# Patient Record
Sex: Female | Born: 1935
Health system: Southern US, Community
[De-identification: ages and names within clinical notes are randomized; demographics above are authoritative.]

## PROBLEM LIST (undated history)

## (undated) DIAGNOSIS — E785 Hyperlipidemia, unspecified: Secondary | ICD-10-CM

## (undated) DIAGNOSIS — R310 Gross hematuria: Secondary | ICD-10-CM

## (undated) DIAGNOSIS — I471 Supraventricular tachycardia, unspecified: Secondary | ICD-10-CM

## (undated) DIAGNOSIS — I10 Essential (primary) hypertension: Secondary | ICD-10-CM

## (undated) DIAGNOSIS — N760 Acute vaginitis: Secondary | ICD-10-CM

## (undated) HISTORY — PX: APPENDECTOMY: SHX54

## (undated) HISTORY — DX: Gross hematuria: R31.0

## (undated) HISTORY — DX: Acute vaginitis: N76.0

## (undated) HISTORY — DX: Hyperlipidemia, unspecified: E78.5

## (undated) HISTORY — PX: CARPAL TUNNEL RELEASE: SHX101

## (undated) HISTORY — PX: DILATION AND CURETTAGE OF UTERUS: SHX78

## (undated) HISTORY — DX: Supraventricular tachycardia, unspecified: I47.10

## (undated) HISTORY — DX: Essential (primary) hypertension: I10

## (undated) HISTORY — PX: OTHER SURGICAL HISTORY: SHX169

## (undated) HISTORY — PX: CATARACT EXTRACTION: SUR2

## (undated) HISTORY — DX: Supraventricular tachycardia: I47.1

## (undated) HISTORY — PX: CYSTOSCOPY: SUR368

---

## 2008-05-20 ENCOUNTER — Emergency Department (HOSPITAL_BASED_OUTPATIENT_CLINIC_OR_DEPARTMENT_OTHER): Admission: EM | Admit: 2008-05-20 | Discharge: 2008-05-20 | Payer: Self-pay | Admitting: Emergency Medicine

## 2008-07-19 ENCOUNTER — Encounter: Payer: Self-pay | Admitting: Emergency Medicine

## 2008-07-19 ENCOUNTER — Encounter (INDEPENDENT_AMBULATORY_CARE_PROVIDER_SITE_OTHER): Payer: Self-pay | Admitting: Internal Medicine

## 2008-07-19 ENCOUNTER — Inpatient Hospital Stay (HOSPITAL_COMMUNITY): Admission: AD | Admit: 2008-07-19 | Discharge: 2008-07-20 | Payer: Self-pay | Admitting: Internal Medicine

## 2008-09-14 ENCOUNTER — Ambulatory Visit: Payer: Self-pay | Admitting: Internal Medicine

## 2008-09-25 DIAGNOSIS — E785 Hyperlipidemia, unspecified: Secondary | ICD-10-CM

## 2008-09-25 DIAGNOSIS — I471 Supraventricular tachycardia: Secondary | ICD-10-CM | POA: Insufficient documentation

## 2008-09-25 DIAGNOSIS — I10 Essential (primary) hypertension: Secondary | ICD-10-CM | POA: Insufficient documentation

## 2008-10-12 ENCOUNTER — Ambulatory Visit: Payer: Self-pay | Admitting: Family Medicine

## 2008-10-12 DIAGNOSIS — N952 Postmenopausal atrophic vaginitis: Secondary | ICD-10-CM

## 2008-10-12 DIAGNOSIS — R319 Hematuria, unspecified: Secondary | ICD-10-CM | POA: Insufficient documentation

## 2008-10-12 DIAGNOSIS — M810 Age-related osteoporosis without current pathological fracture: Secondary | ICD-10-CM

## 2008-10-12 LAB — CONVERTED CEMR LAB
Blood in Urine, dipstick: NEGATIVE
Nitrite: NEGATIVE
Protein, U semiquant: NEGATIVE

## 2008-10-15 ENCOUNTER — Encounter (INDEPENDENT_AMBULATORY_CARE_PROVIDER_SITE_OTHER): Payer: Self-pay | Admitting: *Deleted

## 2008-10-15 LAB — CONVERTED CEMR LAB
ALT: 19 units/L (ref 0–35)
AST: 20 units/L (ref 0–37)
Albumin: 3.9 g/dL (ref 3.5–5.2)
Basophils Relative: 0.1 % (ref 0.0–3.0)
Calcium: 9.6 mg/dL (ref 8.4–10.5)
Creatinine, Ser: 0.5 mg/dL (ref 0.4–1.2)
Eosinophils Relative: 2.1 % (ref 0.0–5.0)
HDL: 66.7 mg/dL (ref >39.0)
LDL Cholesterol: 112 mg/dL — ABNORMAL HIGH (ref 0–99)
MCHC: 34.8 g/dL (ref 30.0–36.0)
Platelets: 188 10*3/uL (ref 150–400)
RBC: 4.55 M/uL (ref 3.87–5.11)
RDW: 13.4 % (ref 11.5–14.6)
Sodium: 137 meq/L (ref 135–145)
Total Bilirubin: 0.9 mg/dL (ref 0.3–1.2)
Total CHOL/HDL Ratio: 2.9
Total Protein: 7.2 g/dL (ref 6.0–8.3)
Triglycerides: 72 mg/dL (ref 0–149)
VLDL: 14 mg/dL (ref 0–40)

## 2008-11-07 LAB — HM DEXA SCAN

## 2008-11-16 ENCOUNTER — Encounter: Payer: Self-pay | Admitting: Family Medicine

## 2008-11-26 ENCOUNTER — Ambulatory Visit: Payer: Self-pay | Admitting: Family Medicine

## 2008-11-28 ENCOUNTER — Encounter (INDEPENDENT_AMBULATORY_CARE_PROVIDER_SITE_OTHER): Payer: Self-pay | Admitting: *Deleted

## 2009-04-05 ENCOUNTER — Encounter (INDEPENDENT_AMBULATORY_CARE_PROVIDER_SITE_OTHER): Payer: Self-pay | Admitting: *Deleted

## 2009-04-10 ENCOUNTER — Ambulatory Visit: Payer: Self-pay | Admitting: Family Medicine

## 2009-04-10 LAB — CONVERTED CEMR LAB
Glucose, Urine, Semiquant: NEGATIVE
Protein, U semiquant: NEGATIVE
Specific Gravity, Urine: 1.02
Urobilinogen, UA: 0.2
WBC Urine, dipstick: NEGATIVE

## 2009-04-12 ENCOUNTER — Telehealth (INDEPENDENT_AMBULATORY_CARE_PROVIDER_SITE_OTHER): Payer: Self-pay | Admitting: *Deleted

## 2009-04-12 LAB — CONVERTED CEMR LAB
AST: 23 units/L (ref 0–37)
Alkaline Phosphatase: 53 units/L (ref 39–117)
Basophils Absolute: 0 10*3/uL (ref 0.0–0.1)
CO2: 30 meq/L (ref 19–32)
Calcium: 9.6 mg/dL (ref 8.4–10.5)
Chloride: 103 meq/L (ref 96–112)
Cholesterol: 198 mg/dL (ref 0–200)
Creatinine, Ser: 0.7 mg/dL (ref 0.4–1.2)
Eosinophils Relative: 1.6 % (ref 0.0–5.0)
GFR calc non Af Amer: 86.93 mL/min (ref >60)
Lymphocytes Relative: 22.3 % (ref 12.0–46.0)
Potassium: 4.1 meq/L (ref 3.5–5.1)
Sodium: 140 meq/L (ref 135–145)
Total Bilirubin: 1 mg/dL (ref 0.3–1.2)
Total Protein: 7.6 g/dL (ref 6.0–8.3)
Triglycerides: 59 mg/dL (ref 0.0–149.0)
VLDL: 11.8 mg/dL (ref 0.0–40.0)
WBC: 7.5 10*3/uL (ref 4.5–10.5)

## 2009-04-16 ENCOUNTER — Telehealth (INDEPENDENT_AMBULATORY_CARE_PROVIDER_SITE_OTHER): Payer: Self-pay | Admitting: *Deleted

## 2009-05-08 ENCOUNTER — Ambulatory Visit: Payer: Self-pay | Admitting: Internal Medicine

## 2009-05-09 ENCOUNTER — Ambulatory Visit: Payer: Self-pay | Admitting: Family Medicine

## 2009-05-09 DIAGNOSIS — R300 Dysuria: Secondary | ICD-10-CM | POA: Insufficient documentation

## 2009-05-09 LAB — CONVERTED CEMR LAB
Glucose, Urine, Semiquant: NEGATIVE
Nitrite: NEGATIVE
Protein, U semiquant: NEGATIVE
Specific Gravity, Urine: 1.02
WBC Urine, dipstick: NEGATIVE

## 2009-05-10 ENCOUNTER — Encounter: Payer: Self-pay | Admitting: Family Medicine

## 2009-05-13 ENCOUNTER — Encounter: Payer: Self-pay | Admitting: Family Medicine

## 2009-06-14 ENCOUNTER — Encounter (INDEPENDENT_AMBULATORY_CARE_PROVIDER_SITE_OTHER): Payer: Self-pay | Admitting: *Deleted

## 2009-08-20 ENCOUNTER — Telehealth (INDEPENDENT_AMBULATORY_CARE_PROVIDER_SITE_OTHER): Payer: Self-pay | Admitting: *Deleted

## 2009-10-16 ENCOUNTER — Telehealth (INDEPENDENT_AMBULATORY_CARE_PROVIDER_SITE_OTHER): Payer: Self-pay | Admitting: *Deleted

## 2009-10-24 ENCOUNTER — Ambulatory Visit: Payer: Self-pay | Admitting: Family Medicine

## 2009-10-24 LAB — CONVERTED CEMR LAB
Bilirubin Urine: NEGATIVE
Nitrite: NEGATIVE
Specific Gravity, Urine: 1.02
WBC Urine, dipstick: NEGATIVE
pH: 6

## 2009-10-25 ENCOUNTER — Encounter: Payer: Self-pay | Admitting: Family Medicine

## 2009-11-07 ENCOUNTER — Ambulatory Visit: Payer: Self-pay | Admitting: Family Medicine

## 2009-11-07 LAB — CONVERTED CEMR LAB
Glucose, Urine, Semiquant: NEGATIVE
Ketones, urine, test strip: NEGATIVE
Specific Gravity, Urine: 1.01
Urobilinogen, UA: 0.2

## 2010-03-25 ENCOUNTER — Telehealth (INDEPENDENT_AMBULATORY_CARE_PROVIDER_SITE_OTHER): Payer: Self-pay | Admitting: *Deleted

## 2010-04-02 ENCOUNTER — Ambulatory Visit: Payer: Self-pay | Admitting: Family Medicine

## 2010-04-03 LAB — CONVERTED CEMR LAB
BUN: 17 mg/dL (ref 6–23)
Basophils Absolute: 0 10*3/uL (ref 0.0–0.1)
Basophils Relative: 0.2 % (ref 0.0–3.0)
Bilirubin, Direct: 0.2 mg/dL (ref 0.0–0.3)
Chloride: 99 meq/L (ref 96–112)
Creatinine, Ser: 0.6 mg/dL (ref 0.4–1.2)
GFR calc non Af Amer: 107.71 mL/min (ref >60)
HCT: 43.6 % (ref 36.0–46.0)
HDL: 52.7 mg/dL (ref >39.00)
Hgb A1c MFr Bld: 5.7 % (ref 4.6–6.5)
Lymphocytes Relative: 13.4 % (ref 12.0–46.0)
Lymphs Abs: 1.4 10*3/uL (ref 0.7–4.0)
MCHC: 33.3 g/dL (ref 30.0–36.0)
Monocytes Relative: 9.5 % (ref 3.0–12.0)
Neutro Abs: 7.7 10*3/uL (ref 1.4–7.7)
RDW: 14.8 % — ABNORMAL HIGH (ref 11.5–14.6)
TSH: 1.33 microintl units/mL (ref 0.35–5.50)
Total Bilirubin: 0.7 mg/dL (ref 0.3–1.2)
Total CHOL/HDL Ratio: 3
VLDL: 12.6 mg/dL (ref 0.0–40.0)
Vit D, 25-Hydroxy: 52 ng/mL (ref 30–89)
WBC: 10.2 10*3/uL (ref 4.5–10.5)

## 2010-04-20 ENCOUNTER — Emergency Department (HOSPITAL_BASED_OUTPATIENT_CLINIC_OR_DEPARTMENT_OTHER): Admission: EM | Admit: 2010-04-20 | Discharge: 2010-04-20 | Payer: Self-pay | Admitting: Emergency Medicine

## 2010-04-29 ENCOUNTER — Encounter: Payer: Self-pay | Admitting: Family Medicine

## 2010-06-23 ENCOUNTER — Encounter (INDEPENDENT_AMBULATORY_CARE_PROVIDER_SITE_OTHER): Payer: Self-pay | Admitting: *Deleted

## 2010-06-23 ENCOUNTER — Encounter: Payer: Self-pay | Admitting: Family Medicine

## 2010-09-19 ENCOUNTER — Encounter: Payer: Self-pay | Admitting: Family Medicine

## 2010-10-02 NOTE — Progress Notes (Signed)
Summary: refill  Phone Note Refill Request Message from:  Fax from Pharmacy on March 25, 2010 9:38 AM  Refills Requested: Medication #1:  DIOVAN 160 MG TABS take one tablet daily  Medication #2:  SPIRONOLACTONE 25 MG TABS take one tablet two times a day [facility] fax [phone] Jani Files [phone]  Initial call taken by: Okey Regal [place],  March 25, 2010 9:38 AM    Prescriptions: SPIRONOLACTONE 25 MG TABS (SPIRONOLACTONE) take one tablet two times a day  #180 x 0   Entered by:   Jeremy Johann CMA   Authorized by:   Loreen Freud DO   Signed by:   Jeremy Johann CMA on 03/25/2010   Method used:   Faxed to ...       [facility] (mail-order)       [address]       [place], [place]  [address]       Ph: [phone]       Fax: [phone]   RxID:   [id number] DIOVAN 160 MG TABS (VALSARTAN) take one tablet daily  #90 x 0   Entered by:   Jeremy Johann CMA   Authorized by:   Loreen Freud DO   Signed by:   Jeremy Johann CMA on 03/25/2010   Method used:   Faxed to ...       [facility] (mail-order)       [address]       [place], [place]  [address]       Ph: [phone]       Fax: [phone]   RxID:   [id number]

## 2010-10-02 NOTE — Consult Note (Signed)
[facility]   Imported By: Lanelle Bal 05/19/2010 10:17:40  _____________________________________________________________________  External Attachment:    Type:   Image     Comment:   External Document

## 2010-10-02 NOTE — Assessment & Plan Note (Signed)
Summary: CPX   Vital Signs:  Patient profile:   77 year old female Height:      59 inches Weight:      125 pounds BMI:     25.34 Pulse rate:   72 / minute BP sitting:   120 / 80  (left arm)  Vitals Entered By: Doristine Devoid CMA (April 02, 2010 9:49 AM) CC: CPX AND LABS  Vision Screening:      Vision Comments: corrected to 20/20 bilaterally- cataract surgery 01/2010   History of Present Illness: 77 yo woman here today for CPE.  Here for Medicare AWV:  1.   Risk factors based on Past M, S, F history: HTN- BP well controlled on current meds.  asymptomatic. Hyperlipidemia- taking fish oil b/c she is statin intolerant, due for labs SVT- was told by cards to f/u as needed.  no recent sxs. Hematuria- pt asymptomatic recently but has had microscopic in the past w/out urologic w/u Osteroporosis- over due for Dexa.  on fosamax and Ca 2.   Physical Activities:  [facility]obics 3.   Depression/mood: no depressive sxs 4.   Hearing: no concerns 5.   ADL's: independent 6.   Fall Risk:  not at risk 7.   Home Safety: safe at home, lives alone but sister lives down the street, daughter local 8.   Height, weight, &visual acuity: see vitals, eye exam done after Cataract sugery in June- corrected to 20/20 w/ new lenses. 9.   Counseling:  10.   Labs ordered based on risk factors: See A&P 11.           Referral Coordination:  overdue for DEXA, mammogram.  still not interested in colonoscopy.  no longer having paps or pelvic exams. 12.           Care Plan:  See A&P 13.           Cognitive Assessment: oriented x3, W-O-R-L-D, 3/3 object recall  Preventive Screening-Counseling & Management  Alcohol-Tobacco     Alcohol drinks/day: 0     Smoking Status: quit  Caffeine-Diet-Exercise     Does Patient Exercise: yes      Drug Use:  never.    Problems Prior to Update: 1)  Physical Examination  (ICD-V70.0) 2)  Dysuria  (ICD-788.1) 3)  Hematuria  (ICD-599.70) 4)   Osteoporosis  (ICD-733.00) 5)  Vaginitis, Atrophic, Postmenopausal  (ICD-627.3) 6)  Hyperlipidemia-mixed  (ICD-272.4) 7)  Hypertension, Benign  (ICD-401.1) 8)  Svt/ Psvt/ Pat  (ICD-427.0)  Current Medications (verified): 1)  Diovan 160 Mg Tabs (Valsartan) .... Take One Tablet Daily 2)  Spironolactone 25 Mg Tabs (Spironolactone) .... Take One Tablet Two Times A Day 3)  Nadolol 20 Mg Tabs (Nadolol) .... Take One Tablet Daily 4)  Fish Oil 1000 Mg Caps (Omega-3 Fatty Acids) .... Take One Tablet Two Times A Day 5)  Ocuvite  Tabs (Multiple Vitamins-Minerals) .... Take One Tablet Two Times A Day 6)  Calcium 600 600 Mg Tabs (Calcium Carbonate) .... Take One Tablet Two Times A Day 7)  Fosamax 70 Mg Tabs (Alendronate Sodium) .... Take One Tablet Every Week 8)  Estrace 0.1 Mg/gm Crea (Estradiol) .... Use Once Weekly 9)  Vitamin D3 400 Unit Tabs (Cholecalciferol) .... Once Daily 10)  Zylanmental .... Take One Tablet Two Times A Day- Vitamin Supplement  Allergies (verified): 1)  ! Sulfa 2)  ! * Statins 3)  ! * Lisinopril 4)  ! * Zetia  5)  ! Hydrochlorothiazide 6)  ! Norvasc 7)  * Latex  Past History:  Past Medical History: Last updated: 10/12/2008  1. Hypertension.   2. Hyperlipidemia.   3. Gross hematuria   4. Supraventricular tachycardia   5. Atropic vaginitis  6. Osteoporosis  Family History: Last updated: 04/10/2009 CAD-no HTN-mother,father DM-maternal grandmother STROKE-no COLON CA-no BREAST CA-no  mother is 23 father died at 65 from post-op PNA  Social History: Last updated: 10/12/2008 The patient lives in [place], [place].  She has a history of tobacco, less than 1 pack per day, but quit 10 years ago.  She rarely drinks alcohol.  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  Widow/Widower (husband died 8)  Past Surgical History: Caesarean section D&C x1 miscarriage x2 R fibrocystic mass removed Appendectomy Cataract extraction- L eye Carpal  tunnel release- R wrist  Social History: Smoking Status:  quit Does Patient Exercise:  yes Drug Use:  never  Review of Systems       The patient complains of weight loss.  The patient denies anorexia, fever, weight gain, vision loss, decreased hearing, hoarseness, chest pain, syncope, dyspnea on exertion, peripheral edema, prolonged cough, headaches, abdominal pain, melena, severe indigestion/heartburn, hematuria, suspicious skin lesions, depression, abnormal bleeding, enlarged lymph nodes, and breast masses.         pt has been watching her weight recently- controlling portions, fat intake.  Physical Exam  General:  Well-developed,well-nourished,in no acute distress; alert,appropriate and cooperative throughout examination Head:  Normocephalic and atraumatic without obvious abnormalities. No apparent alopecia or balding. Eyes:  No corneal or conjunctival inflammation noted. EOMI. Perrla. Funduscopic exam deferred to ophtho Elmer Picker) Ears:  External ear exam shows no significant lesions or deformities.  Otoscopic examination reveals clear canals, tympanic membranes are intact bilaterally without bulging, retraction, inflammation or discharge. Hearing is grossly normal bilaterally. Nose:  External nasal examination shows no deformity or inflammation. Nasal mucosa are pink and moist without lesions or exudates. Mouth:  Oral mucosa and oropharynx without lesions or exudates.  Teeth in good repair. Neck:  No deformities, masses, or tenderness noted. Breasts:  No mass, nodules, thickening, tenderness, bulging, retraction, inflamation, nipple discharge or skin changes noted.   Lungs:  Normal respiratory effort, chest expands symmetrically. Lungs are clear to auscultation, no crackles or wheezes. Heart:  reg S1/S2 Abdomen:  Bowel sounds positive,abdomen soft and non-tender without masses, organomegaly or hernias noted. Genitalia:  deferred at pt's request Pulses:  +2 carotid, radial,  DP Extremities:  no C/C/E Neurologic:  No cranial nerve deficits noted. Station and gait are normal. Plantar reflexes are down-going bilaterally. DTRs are symmetrical throughout. Sensory, motor and coordinative functions appear intact. Skin:  Intact without suspicious lesions or rashes Cervical Nodes:  No lymphadenopathy noted Axillary Nodes:  No palpable lymphadenopathy Psych:  Cognition and judgment appear intact. Alert and cooperative with normal attention span and concentration. No apparent delusions, illusions, hallucinations   Impression & Recommendations:  Problem # 1:  PHYSICAL EXAMINATION (ICD-V70.0) Assessment New pt's PE WNL.  pneumovax given so pt is now UTD.  check labs.  overdue for mammo and DEXA- plans on scheduling.  Problem # 2:  HYPERTENSION, BENIGN (ICD-401.1) Assessment: Unchanged well controlled on current meds.  asymptomatic.  no changes at this time. Her updated medication list for this problem includes:    Diovan 160 Mg Tabs (Valsartan) .Marland Kitchen... Take one tablet daily    Spironolactone 25 Mg Tabs (Spironolactone) .Marland Kitchen... Take one tablet two times a day  Nadolol 20 Mg Tabs (Nadolol) .Marland Kitchen... Take one tablet daily  Orders: Venipuncture ([address]) TLB-BMP (Basic Metabolic Panel-BMET) (80048-METABOL) TLB-CBC Platelet - w/Differential (85025-CBCD) TLB-TSH (Thyroid Stimulating Hormone) (84443-TSH)  Problem # 3:  HYPERLIPIDEMIA-MIXED (ICD-272.4) Assessment: Unchanged due for labs.  intolerant of statins Orders: TLB-Lipid Panel (80061-LIPID) TLB-Hepatic/Liver Function Pnl (80076-HEPATIC)  Problem # 4:  HEMATURIA (ICD-599.70) Assessment: Unchanged if persistant blood pt now agreeable to urology w/u. Orders: UA Dipstick w/o Micro (manual) ([address]454)  Problem # 5:  OSTEOPOROSIS (ICD-733.00) Assessment: Unchanged pt due for DEXA.  will schedule.  continue meds at this time. Her updated medication list for this problem includes:    Calcium 600 600 Mg Tabs (Calcium  carbonate) .Marland Kitchen... Take one tablet two times a day    Fosamax 70 Mg Tabs (Alendronate sodium) .Marland Kitchen... Take one tablet every week    Vitamin D3 400 Unit Tabs (Cholecalciferol) ..... Once daily  Orders: T-Vitamin D (25-Hydroxy) [phone])  Problem # 6:  SVT/ PSVT/ PAT (ICD-427.0) Assessment: Unchanged asymptomatic today.  released by cards- to f/u as needed. Her updated medication list for this problem includes:    Nadolol 20 Mg Tabs (Nadolol) .Marland Kitchen... Take one tablet daily  Complete Medication List: 1)  Diovan 160 Mg Tabs (Valsartan) .... Take one tablet daily 2)  Spironolactone 25 Mg Tabs (Spironolactone) .... Take one tablet two times a day 3)  Nadolol 20 Mg Tabs (Nadolol) .... Take one tablet daily 4)  Fish Oil 1000 Mg Caps (Omega-3 fatty acids) .... Take one tablet two times a day 5)  Ocuvite Tabs (Multiple vitamins-minerals) .... Take one tablet two times a day 6)  Calcium 600 600 Mg Tabs (Calcium carbonate) .... Take one tablet two times a day 7)  Fosamax 70 Mg Tabs (Alendronate sodium) .... Take one tablet every week 8)  Estrace 0.1 Mg/gm Crea (Estradiol) .... Use once weekly 9)  Vitamin D3 400 Unit Tabs (Cholecalciferol) .... Once daily 10)  Zylanmental  .... Take one tablet two times a day- vitamin supplement  Patient Instructions: 1)  Call and schedule your mammogram and bone density 2)  You look great!  Keep up the good work! 3)  We'll notify you of your lab results 4)  Call with any questions or concerns 5)  See you in 6 months!  Appended Document: CPX     Clinical Lists Changes  Orders: Added new Service order of Pneumococcal Vaccine ([address]) - Signed Added new Service order of Admin 1st Vaccine (13086) - Signed Observations: Added new observation of PNEUMOVAXLOT: 0578AA (04/02/2010 10:26) Added new observation of PNEUMOVAXEXP: 09/17/2011 (04/02/2010 10:26) Added new observation of PNEUMOVAXBY: Chemira Jones CMA (04/02/2010 10:26) Added new observation of  PNEUMOVAXRTE: IM (04/02/2010 10:26) Added new observation of PNEUMOVAXDOS: 0.5 ml (04/02/2010 10:26) Added new observation of PNEUMOVAXMFR: Merck (04/02/2010 10:26) Added new observation of PNEUMOVAXSIT: left deltoid (04/02/2010 10:26) Added new observation of PNEUMOVAX: Pneumovax (04/02/2010 10:26)       Immunizations Administered:  Pneumonia Vaccine:    Vaccine Type: Pneumovax    Site: left deltoid    Mfr: Merck    Dose: 0.5 ml    Route: IM    Given by: Doristine Devoid CMA    Exp. Date: 09/17/2011    Lot #: [id number]  Appended Document: CPX   Appended Document: CPX     Clinical Lists Changes  Orders: Added new Referral order of Urology Referral (Urology) - Signed

## 2010-10-02 NOTE — Progress Notes (Signed)
Summary: rx  Phone Note Refill Request Message from:  Patient  Refills Requested: Medication #1:  SPIRONOLACTONE 25 MG TABS take one tablet two times a day pt needs rx sent to [facility], going out of town next few days, will make appt when back in town  Initial call taken by: Kandice Hams,  October 16, 2009 1:58 PM  Follow-up for Phone Call        pt informed rx faxed .Kandice Hams  October 16, 2009 2:01 PM     Prescriptions: SPIRONOLACTONE 25 MG TABS (SPIRONOLACTONE) take one tablet two times a day  #60 x 0   Entered by:   Kandice Hams   Authorized by:   Nolon Rod. Paz MD   Signed by:   Kandice Hams on 10/16/2009   Method used:   Faxed to ...       [facility] [id number] (retail)       [address] [place], [place]  [address]       Ph: [phone] or [phone]       Fax: [phone]   RxID:   [id number]

## 2010-10-02 NOTE — Assessment & Plan Note (Signed)
Summary: blood in stool//lch   Vital Signs:  Patient profile:   77 year old female Weight:      135 pounds Temp:     98.2 degrees F oral Pulse rate:   80 / minute Pulse rhythm:   regular BP sitting:   122 / 80  (left arm) Cuff size:   regular  Vitals Entered By: Army Fossa CMA (October 24, 2009 2:46 PM) CC: Pt states she saw blood in the toliet this a.m. she first thought it was from her stool she now believes in maybe her urine. , Dysuria   History of Present Illness:  Dysuria      This is a 77 year old woman who presents with Dysuria.  The symptoms began 1 day ago.  The patient complains of urinary frequency and urgency, but denies burning with urination, hematuria, vaginal discharge, vaginal itching, and vaginal sores.  The patient denies the following associated symptoms: nausea, vomiting, fever, shaking chills, flank pain, abdominal pain, back pain, pelvic pain, and arthralgias.  The patient denies the following risk factors: diabetes, prior antibiotics, immunosuppression, history of GU anomaly, history of pyelonephritis, pregnancy, history of STD, and analgesic abuse.  History is significant for recent UTI.    Allergies: 1)  ! Sulfa 2)  ! * Statins 3)  ! * Lisinopril 4)  ! * Zetia 5)  ! Hydrochlorothiazide 6)  ! Norvasc 7)  * Latex  Past History:  Past medical, surgical, family and social histories (including risk factors) reviewed for relevance to current acute and chronic problems.  Past Medical History: Reviewed history from 10/12/2008 and no changes required.  1. Hypertension.   2. Hyperlipidemia.   3. Gross hematuria   4. Supraventricular tachycardia   5. Atropic vaginitis  6. Osteoporosis  Past Surgical History: Reviewed history from 10/12/2008 and no changes required. Caesarean section D&C x1 miscarriage x2 R fibrocystic mass removed Appendectomy  Family History: Reviewed history from 04/10/2009 and no changes  required. CAD-no HTN-mother,father DM-maternal grandmother STROKE-no COLON CA-no BREAST CA-no  mother is 71 father died at 93 from post-op PNA  Social History: Reviewed history from 10/12/2008 and no changes required. [place].  She has a history of tobacco, less than 1 pack per day, but quit 10 years ago.  She rarely drinks alcohol.  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  Widow/Widower (husband died 01/12/1999)  Review of Systems      See HPI  Physical Exam  General:  Well-developed,well-nourished,in no acute distress; alert,appropriate and cooperative throughout examination Psych:  Oriented X3 and normally interactive.     Impression & Recommendations:  Problem # 1:  HEMATURIA (ICD-599.70)  Her updated medication list for this problem includes:    Cipro 500 Mg Tabs (Ciprofloxacin hcl) .Marland Kitchen... 1 by mouth two times a day  Orders: T-Culture, Urine ([id number]) UA Dipstick w/o Micro (manual) (09811)  Discussed medication use and hematuria work-up.   Complete Medication List: 1)  Diovan 160 Mg Tabs (Valsartan) .... Take one tablet daily 2)  Spironolactone 25 Mg Tabs (Spironolactone) .... Take one tablet two times a day 3)  Nadolol 20 Mg Tabs (Nadolol) .... Take one tablet daily 4)  Fish Oil 1000 Mg Caps (Omega-3 fatty acids) .... Take one tablet two times a day 5)  Ocuvite Tabs (Multiple vitamins-minerals) .... Take one tablet two times a day 6)  Calcium 600 600 Mg Tabs (Calcium carbonate) .... Take one tablet two times a day  7)  Fosamax 70 Mg Tabs (Alendronate sodium) .... Take one tablet every week 8)  Estrace 0.1 Mg/gm Crea (Estradiol) .... Use once weekly 9)  Vitamin D3 400 Unit Tabs (Cholecalciferol) .... Once daily 10)  Cipro 500 Mg Tabs (Ciprofloxacin hcl) .Marland Kitchen.. 1 by mouth two times a day  Patient Instructions: 1)  rto August for cpe 2)  recheck urine 2 weeks Prescriptions: CIPRO 500 MG TABS (CIPROFLOXACIN HCL) 1 by  mouth two times a day  #10 x 0   Entered and Authorized by:   Loreen Freud DO   Signed by:   Loreen Freud DO on 10/24/2009   Method used:   Electronically to        [facility] [id number] (retail)       [address] [place], [place]  [address]       Ph: [phone] or [phone]       Fax: [phone]   RxID:   [id number]   Laboratory Results   Urine Tests    Routine Urinalysis   Color: yellow Appearance: Clear Glucose: negative   (Normal Range: Negative) Bilirubin: negative   (Normal Range: Negative) Ketone: negative   (Normal Range: Negative) Spec. Gravity: 1.020   (Normal Range: 1.003-1.035) Blood: trace-lysed   (Normal Range: Negative) pH: 6.0   (Normal Range: 5.0-8.0) Protein: negative   (Normal Range: Negative) Urobilinogen: 0.2   (Normal Range: 0-1) Nitrite: negative   (Normal Range: Negative) Leukocyte Esterace: negative   (Normal Range: Negative)    Comments: Army Fossa CMA  October 24, 2009 2:54 PM

## 2010-10-02 NOTE — Miscellaneous (Signed)
   Clinical Lists Changes  Observations: Added new observation of FLU VAX: Historical (06/23/2010 16:51)      Immunization History:  Influenza Immunization History:    Influenza:  historical (06/23/2010)

## 2010-10-16 NOTE — Letter (Signed)
[facility]   Imported By: Maryln Gottron 10/06/2010 10:41:52  _____________________________________________________________________  External Attachment:    Type:   Image     Comment:   External Document

## 2010-11-06 ENCOUNTER — Telehealth: Payer: Self-pay | Admitting: Family Medicine

## 2010-11-11 NOTE — Progress Notes (Signed)
Summary: Refill--Due for appt  Phone Note Refill Request Message from:  Patient  Refills Requested: Medication #1:  SPIRONOLACTONE 25 MG TABS take one tablet two times a day [facility] [address] [place]  --- patient changing to local pharmacy  Initial call taken by: Okey Regal [place],  November 06, 2010 2:31 PM    Prescriptions: SPIRONOLACTONE 25 MG TABS (SPIRONOLACTONE) take one tablet two times a day  #60 x 0   Entered by:   Lucious Groves CMA   Authorized by:   Neena Rhymes MD   Signed by:   Lucious Groves CMA on 11/06/2010   Method used:   Electronically to        [facility] [address] (retail)       [address]       [place], [place]  [address]       Ph: [phone]       Fax: [phone]   RxID:   [id number]

## 2010-11-25 ENCOUNTER — Encounter: Payer: Self-pay | Admitting: Family Medicine

## 2010-12-01 ENCOUNTER — Encounter: Payer: Self-pay | Admitting: Family Medicine

## 2010-12-01 ENCOUNTER — Ambulatory Visit (INDEPENDENT_AMBULATORY_CARE_PROVIDER_SITE_OTHER): Payer: Medicare Other | Admitting: Family Medicine

## 2010-12-01 VITALS — BP 130/76 | HR 72 | Temp 98.0°F | Ht 59.0 in | Wt 119.0 lb

## 2010-12-01 DIAGNOSIS — I10 Essential (primary) hypertension: Secondary | ICD-10-CM

## 2010-12-01 MED ORDER — SPIRONOLACTONE 25 MG PO TABS: 25.0000 mg | ORAL_TABLET | Freq: Two times a day (BID) | ORAL | Status: DC

## 2010-12-01 MED ORDER — ALENDRONATE SODIUM 70 MG PO TABS: 70.0000 mg | ORAL_TABLET | ORAL | Status: DC

## 2010-12-01 MED ORDER — NADOLOL 20 MG PO TABS: 20.0000 mg | ORAL_TABLET | Freq: Two times a day (BID) | ORAL | Status: DC

## 2010-12-01 MED ORDER — NADOLOL 20 MG PO TABS: 20.0000 mg | ORAL_TABLET | Freq: Every day | ORAL | Status: DC

## 2010-12-01 MED ORDER — VALSARTAN 160 MG PO TABS: 160.0000 mg | ORAL_TABLET | Freq: Every day | ORAL | Status: DC

## 2010-12-01 NOTE — Progress Notes (Signed)
  Subjective:    Patient ID: Shannon Austin, female    DOB: Jan 15, 1933, 77 y.o.   MRN: [id number]  HPI HTN- BP well controlled today.  On Aldactone, Diovan, Nadolol.  No CP, SOB, HAs, visual changes, edema.  Pt reports recent CMP was 'normal', prefers not to repeat today.   Review of Systems For ROS see HPI     Objective:   Physical Exam  Constitutional: She is oriented to person, place, and time. She appears well-developed and well-nourished. No distress.  HENT:  Head: Normocephalic and atraumatic.  Eyes: Conjunctivae and EOM are normal. Pupils are equal, round, and reactive to light.  Neck: Normal range of motion. Neck supple.  Cardiovascular: Normal rate, regular rhythm, normal heart sounds and intact distal pulses.   Pulmonary/Chest: Breath sounds normal. No respiratory distress. She has no wheezes.  Musculoskeletal: She exhibits no edema.  Neurological: She is alert and oriented to person, place, and time. No cranial nerve deficit.  Skin: Skin is warm and dry.          Assessment & Plan:

## 2010-12-01 NOTE — Assessment & Plan Note (Signed)
Pt's BP adequately controlled.  Asymptomatic.  Reports recent CMP w/ urology was normal and prefers not to repeat.  Will continue to follow.

## 2010-12-01 NOTE — Progress Notes (Signed)
Addended byCandie Echevaria on: 12/01/2010 03:31 PM   Modules accepted: Orders

## 2010-12-01 NOTE — Patient Instructions (Signed)
Follow up in August for your complete physical- do not eat before this appt Keep up the good work!  You look great! Call with any questions or concerns [place]!!!

## 2010-12-03 ENCOUNTER — Other Ambulatory Visit: Payer: Self-pay | Admitting: *Deleted

## 2010-12-03 MED ORDER — NADOLOL 20 MG PO TABS: 20.0000 mg | ORAL_TABLET | Freq: Every day | ORAL | Status: DC

## 2010-12-03 NOTE — Telephone Encounter (Signed)
The office received fax about Nadolol Tabs asking if the pt should be taking it once a day or twice a day. Per Dr. Beverely Low the pt will take it once a day. New rx sent to [facility].

## 2011-01-13 NOTE — Discharge Summary (Signed)
NAMEJAYDYNN, WOLFORD NO.:  [id number]   MEDICAL RECORD NO.:  [id number]          PATIENT TYPE:  OBV   LOCATION:  3733                         FACILITY:  [facility]   PHYSICIAN:  Eduard Clos, MDDATE OF BIRTH:  01-Jul-1933   DATE OF ADMISSION:  07/19/2008  DATE OF DISCHARGE:  07/20/2008                               DISCHARGE SUMMARY   COURSE IN THE HOSPITAL:  A 77 year old female with history of  hypertension, osteoporosis, who presented here complaining of hematuria.  Eventually, the patient also was found to have developed SVT.  Initial  EKG shows SVT pattern with heart rate around 163 beats per minute.  Eventually, the patient was started on Cardizem drip, which was  discontinued after her rate was controlled.  The patient also had a UA,  which was compatible with infection.  Cultures are growing E. coli.  At  this time, the patient is completely asymptomatic.  She is in sinus  rhythm and she has no symptoms.  A CT of the abdomen and pelvis was  done, which also did not show any acute process.  At this time, the  patient is willing to go home.  I have advised that she will need to  follow up with urologist and cardiologist for which 5 months have been  set up.  She agrees to follow with them.  The patient was discharged  home on the regular home medication with Cipro for 7 more days.  Any  cultures to be followed with the primary care physician.   PROCEDURES DURING THIS STAY:  CT abdomen and pelvis on July 19, 2008.  Small hiatal hernia.  No acute abdominal abnormality.  No acute  pelvic abnormality.  Fibroid uterus.  Chest x-ray on July 19, 2008,  shows minor retrocardiac atelectasis or scarring.  No acute  cardiopulmonary abnormality.  A 2-D echo on July 19, 2008, shows an  EF of 60% to 65%.  Overall, left ventricular systolic function was  vigorous.  No left ventricular regional wall motion abnormality.   FINAL DIAGNOSES:  1. Status post  supraventricular tachycardia.  2. Hematuria.  3. Escherichia coli urinary tract infection.  4. Hypertension.  5. History of osteoporosis.   MEDICATIONS AT DISCHARGE:  1. Ciprofloxacin 500 mg p.o. b.i.d. for 7 days.  2. Diovan 180 mg p.o. daily.  3. Fish oil 1000 mg p.o. b.i.d.  4. Bilberry 1000 mg p.o. b.i.d.  5. Multivitamin 1 tablet p.o. daily.  6. Calcium 600 mg p.o. b.i.d.  7. Fosamax 70 mg p.o. weekly.  8. Vitamin D p.o. daily.  9. Spironolactone 25 mg p.o. b.i.d.  10.TobraDex ophthalmic solution b.i.d.  11.AzaSite ophthalmic solution twice daily.  12.OCuSOFT eyewash b.i.d.   PLAN:  The patient advised to follow with primary care physician within  a week's time.  To follow with Dr. Johney Frame at [facility] on  August 09, 2008, 10:15 a.m., and Dr. Vonita Moss at [facility] on  August 02, 2008, 1:15 p.m.  During the event of any recurrence of  symptom, the patient advised to go to the nearest ER.  The patient is to  be on a cardiac-healthy diet.      Eduard Clos, MD  Electronically Signed     ANK/MEDQ  D:  07/20/2008  T:  07/21/2008  Job:  [id number]

## 2011-01-13 NOTE — Letter (Signed)
September 14, 2008    Eduard Clos, MD  [address] [place], [place] [address]   Thomasenia Bottoms, MD  [address] [place] [address]   RE:  Shannon Austin, Shannon Austin  MRN:  [id number]  /  DOB:  [birth year]   Dear Dr. Toniann Fail and Dr. Gasper Sells,   It was my pleasure to see Shannon Austin in the electrophysiology  consultation regarding supraventricular tachycardia.  She is a very  pleasant 77 year old female with a history of hypertension,  hyperlipidemia, and osteoporosis, who presented to [facility]  on July 19, 2008, for evaluation of gross hematuria.  She reports  waking during the night with frank hematuria.  She reports becoming very  startled and frayed, and therefore presented directly to [facility].  Upon arrival, she is noted to be hemodynamically stable  with a hematocrit of 45.  Interestingly, she developed a narrow complex  tachycardia, which was felt to represent supraventricular tachycardia.  She was placed on Cardizem drip and converted to sinus rhythm.  She was  admitted to your service for further evaluation and management.  It  appears that she was observed without significant arrhythmias  thereafter.  According to your H and P,  she was observed to have  supraventricular tachycardia and there were no EKG or tracing suggestive  of atrial fibrillation.  She was initiated on Bystolic 5 mg daily with  plans to follow up in my office today.  She has done quite well since  that time.  She has had no further hematuria to her knowledge.  An  urinalysis recently revealed no evidence of microscopic hematuria.  She  is also aware of any further symptomatic episodes of tachycardia.  She  denies chest pain, shortness of breath, orthopnea, PND, palpitations,  presyncope, syncope, or other cardiac concerns at this time.  She is  tolerating Bystolic without difficulty, but notes that it is expensive  and would like to consider a  different medication.   PAST MEDICAL HISTORY:  1. Hypertension.  2. Hyperlipidemia.  3. Gross hematuria (as above).  4. Supraventricular tachycardia (as above).   ALLERGIES:  STATINS and ZETIA cause myalgias.  She also reports  allergies to LISINOPRIL, HYDROCHLOROTHIAZIDE, SEPTRA, SULFA, METALS,  LATEX, and PLASTICS.   CURRENT MEDICATIONS:  1. Bystolic 5 mg daily.  2. Diovan 180 mg daily.  3. Aspirin 81 mg daily.  4. Fish oil 1000 mg b.i.d.  5. Bilberry 1000 mg b.i.d.  6. Multivitamin daily.  7. Calcium b.i.d.  8. Fosamax 70 mg every week.  9. Vitamin D daily.  10.Spironolactone 25 mg b.i.d.   SOCIAL HISTORY:  [place]  She  has a history of tobacco, less than 1 pack per day, but quit 10 years  ago.  She rarely drinks alcohol.  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   FAMILY HISTORY:  She denies family history of diabetes or hypertension.   REVIEW OF SYSTEMS:  All systems are reviewed and negative except as  outlined in the HPI above.   PHYSICAL EXAMINATION:  VITALS:  Blood pressure 130/80, heart rate is 76,  respirations 18, and weight 134 pounds.  GENERAL:  The patient is a well-appearing female in no acute distress.  She is alert and oriented x3.  HEENT:  Normocephalic, atraumatic.  Sclerae clear.  Conjunctivae pink.  Oropharynx clear.  NECK:  Supple.  No JVD,  thyromegaly, lymphadenopathy, or bruits.  LUNGS:  Clear to auscultation bilaterally.  HEART:  Regular rate and rhythm.  No murmurs, rubs, or gallops.  GI:  Soft, nontender, and nondistended.  Positive bowel sounds.  EXTREMITIES:  No clubbing, cyanosis, or edema.  NEUROLOGIC:  Cranial nerves II through XII are intact.  Strength and  sensation are intact.  SKIN:  No ecchymosis or lacerations.  MUSCULOSKELETAL:  No deformity or atrophy.  PSYCHIATRIC:  Euthymic mood.  Full affect.   EKG from July 19, 2008, reveals sinus rhythm with left atrial  enlargement  with nonspecific ST-T wave changes.   IMPRESSION:  Shannon Austin is a pleasant 77 year old female with a history  of hypertension, hyperlipidemia, and a recent episode of narrow complex  tachycardia in the setting of panic and alarm over gross hematuria.  Her  hematuria appears to have resolved.  She has had no further symptomatic  episodes of tachycardia.  My staff has made an exhaustive effort to  obtain tracings or EKGs of her documented tachycardia.  However, this  effort was without success.  Upon contacting [facility] medical records  as well as [employer] [facility] and [facility], these tracings apparently  no longer exist.  She may therefore have had sinus tachycardia,  supraventricular tachycardia, or atrial fibrillation.  As she has had no  further symptomatic episodes, I think that her present strategy should  be low-dose beta-blocker therapy to prevent further tachycardia as well  as to treat her blood pressure.  As she is concerned over the expense of  Bystolic, I have taken the liberty of switching her to nadolol 20 mg  daily, which is on the [employer] 4 dollar plan.  She has had no symptoms  of ischemia or heart failure and I therefore do not think that further  cardiac risk stratification is necessary at this time.  I have taken the  liberty of referring her to Dr. Neena Rhymes with [facility] for a long-term medical management.   PLAN:  1. Bystolic is switched to nadolol 20 mg daily.  2. No other medication changes were made at this time.  3. The patient will follow up with me in 6 months and establish care      with Dr. Neena Rhymes with [facility] at      [facility] in the interim.   Thank you for the opportunity of participating in the care of [name].  Please feel free to contact me if you wish to discuss her care  further.    Sincerely,      Hillis Range, MD  Electronically Signed    JA/MedQ  DD: 09/14/2008  DT:  09/15/2008  Job #: [id number]   CC:    Neena Rhymes, M.D.

## 2011-01-13 NOTE — H&P (Signed)
Shannon Austin, Shannon Austin NO.:  [id number]   MEDICAL RECORD NO.:  [id number]          PATIENT TYPE:  OBV   LOCATION:  3733                         FACILITY:  [facility]   PHYSICIAN:  Thomasenia Bottoms, MDDATE OF BIRTH:  April 02, 1933   DATE OF ADMISSION:  07/19/2008  DATE OF DISCHARGE:                              HISTORY & PHYSICAL   Height is Dr. colonic a history of physical exam on the patient shown  upon back are oral and be a [facility] medical record number [id number] days is  July 19, 2008.???????   CHIEF COMPLAINT:  Hematuria.   HISTORY OF PRESENTING ILLNESS:  Shannon Austin is a healthy 77 year old  woman who woke up about 2:30 in the morning and had to go to the  bathroom.  When she looked down, she noticed the commode had lots of  bright red blood.  She says with clots.  This really scared her.  She  went back to bed and she felt some urinary urgency, but no pain, no  fever.  Otherwise, was okay, but the gross blood in her urine really  scared her, and after a couple of hours of worrying about it at home,  she actually went into the emergency department.  She arrived at the  emergency department shortly before 5 a.m.  On checking at the ER, the  patient tells me the nurse told her that her heart was racing.  The  patient has never had any cardiac trouble at all.  She has not had any  chest pain.  No shortness of breath.  No palpitations.  It was really  just the blood in her urine that worried her, but when she got back in  the ER, they noticed that she was severely tachycardic and she was  essentially treated for that.  We were called and asked to admit the  patient for SVT.   Her past medical history is significant for mild hypertension.  She has  a history of osteoporosis.  She said a dermatologist put her on  spironolactone because her hair was falling out and that has worked for  her, so she remains on the spironolactone.  She says she did have a  negative  stress test she thinks about 3 years ago in [facility].  She  also had an episode of Bell palsy in the past.   MEDICATIONS ON ARRIVAL:  1. Diovan 160 mg p.o. daily.  2. Spironolactone 25 mg twice daily.  3. Aspirin 81 mg daily.  4. Fosamax 70 mg once weekly.  5. Multivitamin twice daily.  6. Bilberry extract twice daily, which is over the counter.  She takes      this to help prevent macular degeneration.  7. She also takes Caltrate plus vitamin D, Omega-3 fish oil, and she      uses Estrace cream twice weekly for atrophic vaginitis.   In the emergency department, the patient received IV Cardizem and was  placed on Cardizem drip.  She also received a dose of IV ciprofloxacin   SOCIAL HISTORY:  She does not smoke  cigarettes, drink alcohol, or use  any illicit drugs.   FAMILY HISTORY:  Significant for mother who has a history of TIAs, but  who is alive and well as 75 years old.  There is a history of macular  degeneration in the family, otherwise family history negative.  The  patient's father died at 22, postop pneumonia.   VITALS ON ARRIVAL:  Her blood pressure was 112/69, her heart rate 83, O2  sats 100% on room air, respiratory rate 21, temperature 98.7.   REVIEW OF SYSTEMS:  CONSTITUTIONAL:  No fevers.  No night sweats.  Appetite excellent.  HEENT:  No headache, no sore throat.  GU:  She had  some urinary urgency this morning after her episode of hematuria, but  generally does not have this trouble at all.  She had it years ago  before she started on the esterase, but that cured that problem.  PSYCHIATRIC:  She has some mild occasional insomnia.  All other systems  reviewed and are negative.   PHYSICAL EXAMINATION:  GENERAL:  The patient is well nourished, well  developed, and in no acute distress.  She appears younger than her  stated age.  HEENT:  Normocephalic, atraumatic.  Her pupils equal and round.  Her  sclerae nonicteric.  Oral mucosa moist.  NECK:  Supple.  No  lymphadenopathy, no thyromegaly, no jugular venous  distention.  CARDIAC:  Regular rate and rhythm.  No murmurs, gallops, or rubs  appreciated.  LUNGS:  Clear to auscultation bilaterally.  No wheezes, no rhonchi, no  rales.  ABDOMEN:  Soft, nontender, and nondistended.  Normoactive bowel sounds.  No masses are appreciated.  No hepatosplenomegaly.  EXTREMITIES:  No evidence of clubbing, cyanosis, or edema.  SKIN:  Intact.  No open lesions or rashes.  MUSCULOSKELETAL:  Good range of motion.  No effusions of her joints.  NEUROLOGIC:  She is alert and oriented x3.  Her cranial nerves II  through XII are intact grossly.  She has 5/5 strength in her upper and  lower extremities.  Her sensory exam is intact grossly in her upper and  lower extremities.  She has normal muscle tone and bulk.   LABORATORY DATA:  Her initial EKG revealed SVT with a rate of 163.  The  patient did receive Cardizem.  She later had an EKG, which reveals sinus  tach with a rate of 113.  The patient's troponin is less than 0.05.  Myoglobin within normal limits.  Sodium is 136, potassium 3.4, chloride  100, bicarb 24, glucose 126, BUN 15, creatinine 0.6.  Protime 13, INR  1.0.  CBC is 15.4, hemoglobin 15.5, hematocrit 45.5, platelet count 228.  Urinalysis reveals cloudy urine, moderate leukocyte esterase, 11-15  wbc's, rare squamous epithelial cells, large blood.   ASSESSMENT AND PLAN:  1. Urinary tract infection and gross hematuria.  The patient has been      started on Cipro.  We will get a urine culture, but she has already      received a dose of Cipro so it may not be completely accurate.  If      her hematuria of course does not resolve, she will need to follow      up with a urologist.  2. Supraventricular tachycardia.  The patient was in normal sinus      rhythm by the time that she arrived here from the outside facility.      It is not clear exactly how long she was  in SVT.  We have no EKG or      tracings  suggestive of atrial fibrillation.  We will go ahead and      monitor tonight on telemetry.  We can stop the Cardizem drip at      this point.  We will rule out for MI, check a TSH, and get an      echocardiogram.  Certainly, if her workup is negative as this was      all brief, I do not think that she requires further workup and may      be discharged.  Certainly, the urinary tract infection could cause      this.  We will be sure to follow her hemoglobins to be sure that      she has not lost a significant amount of blood.  She may be      slightly dehydrated, so we will hydrate her of course.  3. Mild hypokalemia.  We will replace.  4. The patient did have a CT scan on arrival.  It did reveal a stool      bolus and large amount of stool, but the patient has had a bowel      movement since that CAT scan was done.  We will just follow up with      this.  The patient currently does not have a primary care physician      and is looking for one in the [facility] area.      Thomasenia Bottoms, MD  Electronically Signed     CVC/MEDQ  D:  07/19/2008  T:  07/20/2008  Job:  [id number]

## 2011-01-13 NOTE — Letter (Signed)
September 14, 2008    Neena Rhymes, MD  [address] [place] [address]   RE:  [name], [name]  MRN:  [id number]  /  DOB:  August 25, 1933   Dear Dr. Beverely Low:   I am writing to introduce to you Shannon Austin.  She is a very pleasant  77 year old female with a history of hypertension, hyperlipidemia,  osteoporosis, and recently diagnosed supraventricular tachycardia.  She  does not have a primary care physician.  I have recommended that she  pursue establishment within your practice.  She is a very pleasant lady,  who recently presented to the [facility]  Emergency Department at [facility] with supraventricular tachycardia and  gross hematuria.  She received intravenous diltiazem and converted to  sinus rhythm.  I have made multiple attempts to obtain  electrocardiograms of her SVT to exclude atrial fibrillation.  Unfortunately, I have been able to obtain only rhythm strips of sinus  rhythm as well as an EKG, which demonstrated sinus rhythm.  I therefore,  recommended that she take nadolol 20 mg daily to prevent further atrial  arrhythmias.  I would not start her on Coumadin unless we have clear  documentation of atrial fibrillation down the road.  I think that at  this point, it would be helpful if you could follow her blood pressure,  hyperlipidemia, and  osteoporosis.  She reports having a followup urinalysis, which revealed  no evidence of hematuria, and she is unaware of any further gross  hematuria.  I have asked her to follow up with me in my office in 6  months for further management of her arrhythmias.  Please feel free to  contact me, if you have any questions regarding this patient.    Sincerely,      Hillis Range, MD  Electronically Signed    JA/MedQ  DD: 09/14/2008  DT: 09/15/2008  Job #: [id number]

## 2011-02-12 ENCOUNTER — Other Ambulatory Visit: Payer: Self-pay | Admitting: *Deleted

## 2011-02-12 MED ORDER — NADOLOL 20 MG PO TABS: 20.0000 mg | ORAL_TABLET | Freq: Every day | ORAL | Status: DC

## 2011-02-12 NOTE — Telephone Encounter (Signed)
I spoke with the pt and she is aware to check behind [employer] more closely next time. She is aware that we will send #[phone] just in case she does not get her shipment in time.

## 2011-02-13 ENCOUNTER — Other Ambulatory Visit: Payer: Self-pay

## 2011-02-13 MED ORDER — NADOLOL 20 MG PO TABS: 20.0000 mg | ORAL_TABLET | Freq: Every day | ORAL | Status: DC

## 2011-06-01 LAB — URINALYSIS, ROUTINE W REFLEX MICROSCOPIC
Glucose, UA: NEGATIVE
Hgb urine dipstick: NEGATIVE
Ketones, ur: 15 — AB
Protein, ur: NEGATIVE

## 2011-06-01 LAB — URINE CULTURE

## 2011-06-02 LAB — CBC
HCT: 45.5
Hemoglobin: 15.5 — ABNORMAL HIGH
MCHC: 34.1
MCV: 95.9
RDW: 13.4

## 2011-06-02 LAB — POCT CARDIAC MARKERS
CKMB, poc: 1.4
Myoglobin, poc: 48.3
Troponin i, poc: <0.05
Troponin i, poc: <0.05

## 2011-06-02 LAB — URINE CULTURE: Colony Count: >=100000

## 2011-06-02 LAB — DIFFERENTIAL
Basophils Absolute: 0.1
Basophils Relative: 0
Eosinophils Relative: 1
Monocytes Absolute: 1.4 — ABNORMAL HIGH

## 2011-06-02 LAB — BASIC METABOLIC PANEL
CO2: 24
Chloride: 100
Glucose, Bld: 126 — ABNORMAL HIGH
Potassium: 3.4 — ABNORMAL LOW
Sodium: 136

## 2011-06-02 LAB — URINALYSIS, ROUTINE W REFLEX MICROSCOPIC
Bilirubin Urine: NEGATIVE
Nitrite: NEGATIVE
Specific Gravity, Urine: 1.002 — ABNORMAL LOW
Urobilinogen, UA: 0.2

## 2011-06-02 LAB — URINE MICROSCOPIC-ADD ON

## 2011-06-03 LAB — COMPREHENSIVE METABOLIC PANEL
ALT: 14
AST: 22
Albumin: 3.3 — ABNORMAL LOW
Alkaline Phosphatase: 52
BUN: 9
CO2: 25
Calcium: 8.9
Chloride: 107
Creatinine, Ser: 0.68
GFR calc Af Amer: >60
GFR calc non Af Amer: >60
Glucose, Bld: 97
Potassium: 4.1
Sodium: 139
Total Bilirubin: 0.9
Total Protein: 5.9 — ABNORMAL LOW

## 2011-06-03 LAB — B-NATRIURETIC PEPTIDE (CONVERTED LAB): Pro B Natriuretic peptide (BNP): 33

## 2011-06-03 LAB — CBC
HCT: 39
Hemoglobin: 13.5
MCHC: 34.7
MCV: 97.4
Platelets: 208
RBC: 4
RDW: 13.9
WBC: 5.9

## 2011-06-03 LAB — CARDIAC PANEL(CRET KIN+CKTOT+MB+TROPI)
CK, MB: 2.6
CK, MB: 2.9
Relative Index: 1.9
Relative Index: INVALID
Total CK: 153
Total CK: 82
Troponin I: 0.01
Troponin I: <0.01

## 2011-06-03 LAB — MAGNESIUM: Magnesium: 2.1

## 2011-06-03 LAB — TSH: TSH: 1.121

## 2011-10-16 ENCOUNTER — Telehealth: Payer: Self-pay | Admitting: *Deleted

## 2011-10-16 ENCOUNTER — Ambulatory Visit (INDEPENDENT_AMBULATORY_CARE_PROVIDER_SITE_OTHER): Payer: Medicare Other | Admitting: Family Medicine

## 2011-10-16 ENCOUNTER — Encounter: Payer: Self-pay | Admitting: Family Medicine

## 2011-10-16 VITALS — BP 118/82 | HR 84 | Temp 98.0°F | Ht 59.0 in | Wt 126.6 lb

## 2011-10-16 DIAGNOSIS — H8110 Benign paroxysmal vertigo, unspecified ear: Secondary | ICD-10-CM

## 2011-10-16 MED ORDER — MECLIZINE HCL 25 MG PO TABS: 25.0000 mg | ORAL_TABLET | Freq: Three times a day (TID) | ORAL | Status: AC | PRN

## 2011-10-16 NOTE — Progress Notes (Signed)
  Subjective:    Patient ID: Shannon Austin, female    DOB: 1933-04-22, 78 y.o.   MRN: [id number]  HPI Dizziness- woke up overnight to use the restroom, 'popped up, turned my head' and got very dizzy.  sxs improve w/ holding head steady.  Took some benadryl at 11 am and was able to go out to eat w/out sxs.   Review of Systems For ROS see HPI     Objective:   Physical Exam  Vitals reviewed. Constitutional: She is oriented to person, place, and time. She appears well-developed and well-nourished. No distress.  HENT:  Head: Normocephalic and atraumatic.  Nose: Nose normal.  Mouth/Throat: Oropharynx is clear and moist.       No TTP over sinuses TMs normal bilaterally  Eyes: Conjunctivae and EOM are normal. Pupils are equal, round, and reactive to light.  Neck: Normal range of motion. Neck supple.  Cardiovascular: Normal rate, regular rhythm, normal heart sounds and intact distal pulses.   Pulmonary/Chest: Effort normal and breath sounds normal. No respiratory distress. She has no wheezes. She has no rales.  Lymphadenopathy:    She has no cervical adenopathy.  Neurological: She is alert and oriented to person, place, and time. She has normal reflexes. No cranial nerve deficit. Coordination normal.          Assessment & Plan:

## 2011-10-16 NOTE — Telephone Encounter (Signed)
Agree 

## 2011-10-16 NOTE — Assessment & Plan Note (Signed)
Pt's hx and PE consistent w/ vertigo.  Encouraged increased fluids, changing positions slowly, and meclizine prn.  Reviewed supportive care and red flags that should prompt return.  Pt expressed understanding and is in agreement w/ plan.

## 2011-10-16 NOTE — Telephone Encounter (Signed)
Called pt to clarify sxs, pt advised that she noted dizzyness the night before after she jumped out of bed quickly (like she normally does) and after she laid down it subsided, then pt noted yesterday she would get dizzy during activities that involved her bending her head over or if she moved her head fast, pt also advised that she takes vesicare and she noted that dizziness can be a side effect, advised that she does not need to drive here today at 1pm, pt stated her sister is driving her, per MD Tabori instructed her to take benadryl to help with her current sxs, pt understood and will send her sister to get her some benadryl and that she will see [place] at 1pm today

## 2011-10-16 NOTE — Patient Instructions (Signed)
Schedule your complete physical at your convenience Start the Meclizine as needed for dizziness Change positions slowly Call with any questions or concerns Hang in there!

## 2011-10-28 ENCOUNTER — Telehealth: Payer: Self-pay | Admitting: Family Medicine

## 2011-10-28 MED ORDER — ALENDRONATE SODIUM 70 MG PO TABS: 70.0000 mg | ORAL_TABLET | ORAL | Status: DC

## 2011-10-28 NOTE — Telephone Encounter (Signed)
Refill: Alendronate tab 70mg . 90 day supply.

## 2011-10-28 NOTE — Telephone Encounter (Signed)
Sent #90 day supply to [facility], per noted this pharmacy was not updated in pt chart, updated and sent via escribe

## 2011-10-30 ENCOUNTER — Telehealth: Payer: Self-pay | Admitting: Family Medicine

## 2011-10-30 MED ORDER — SPIRONOLACTONE 25 MG PO TABS: 25.0000 mg | ORAL_TABLET | Freq: Two times a day (BID) | ORAL | Status: DC

## 2011-10-30 MED ORDER — NADOLOL 20 MG PO TABS: 20.0000 mg | ORAL_TABLET | Freq: Every day | ORAL | Status: DC

## 2011-10-30 MED ORDER — VALSARTAN 160 MG PO TABS: 160.0000 mg | ORAL_TABLET | Freq: Every day | ORAL | Status: DC

## 2011-10-30 NOTE — Telephone Encounter (Signed)
Called pt to clarify that she is still taking the Naldolol 20mg  per not on her medications list, pt advised that she still takes the naldolol 20mg  daily, sent all medications via escribe

## 2011-10-30 NOTE — Telephone Encounter (Signed)
Refill:  Spironolact tab 25mg , 90 day supply  Nadolol tab 20mg , 90 day supply  Diovan tab 160 mg, 90 day supply

## 2011-11-09 ENCOUNTER — Telehealth: Payer: Self-pay | Admitting: Family Medicine

## 2011-11-09 MED ORDER — ALENDRONATE SODIUM 70 MG PO TABS: 70.0000 mg | ORAL_TABLET | ORAL | Status: DC

## 2011-11-09 NOTE — Telephone Encounter (Signed)
Alendronate Sodium (Tab) 70 MG Take 1 tablet (70 mg total) by mouth every 7 (seven) days. Take with a full glass of water on an empty stomach.

## 2011-11-09 NOTE — Telephone Encounter (Signed)
Please advise last OV 10-16-11

## 2011-11-09 NOTE — Telephone Encounter (Signed)
rx sent to pharmacy by e-script Letter has been mailed to pt address noted in the chart to advise they are overdue for cpe/ov/labs and the pt needs to contact office to set up appt   

## 2011-11-09 NOTE — Telephone Encounter (Signed)
Ok for refill but pt needs to schedule CPE- overdue!

## 2011-11-10 MED ORDER — ALENDRONATE SODIUM 70 MG PO TABS: 70.0000 mg | ORAL_TABLET | ORAL | Status: DC

## 2011-11-10 NOTE — Telephone Encounter (Signed)
rx sent to pharmacy by e-script for fosamax resent per noted error with escribe

## 2011-11-10 NOTE — Telephone Encounter (Signed)
Addended by: Derry Lory A on: 11/10/2011 03:39 PM   Modules accepted: Orders

## 2012-01-07 ENCOUNTER — Ambulatory Visit (INDEPENDENT_AMBULATORY_CARE_PROVIDER_SITE_OTHER): Payer: Medicare Other | Admitting: Family Medicine

## 2012-01-07 ENCOUNTER — Telehealth: Payer: Self-pay | Admitting: Family Medicine

## 2012-01-07 ENCOUNTER — Encounter: Payer: Self-pay | Admitting: Family Medicine

## 2012-01-07 VITALS — BP 112/72 | HR 68 | Temp 98.3°F | Ht 58.75 in | Wt 129.6 lb

## 2012-01-07 DIAGNOSIS — Z0000 Encounter for general adult medical examination without abnormal findings: Secondary | ICD-10-CM | POA: Insufficient documentation

## 2012-01-07 DIAGNOSIS — M810 Age-related osteoporosis without current pathological fracture: Secondary | ICD-10-CM

## 2012-01-07 DIAGNOSIS — I10 Essential (primary) hypertension: Secondary | ICD-10-CM

## 2012-01-07 DIAGNOSIS — E785 Hyperlipidemia, unspecified: Secondary | ICD-10-CM

## 2012-01-07 LAB — CBC WITH DIFFERENTIAL/PLATELET
Basophils Absolute: 0 10*3/uL (ref 0.0–0.1)
Eosinophils Absolute: 0.1 10*3/uL (ref 0.0–0.7)
HCT: 44.5 % (ref 36.0–46.0)
Lymphs Abs: 1.4 10*3/uL (ref 0.7–4.0)
MCHC: 33.5 g/dL (ref 30.0–36.0)
MCV: 99 fl (ref 78.0–100.0)
Monocytes Absolute: 0.7 10*3/uL (ref 0.1–1.0)
Platelets: 225 10*3/uL (ref 150.0–400.0)
RDW: 15 % — ABNORMAL HIGH (ref 11.5–14.6)

## 2012-01-07 LAB — BASIC METABOLIC PANEL
Calcium: 9.1 mg/dL (ref 8.4–10.5)
Creatinine, Ser: 0.6 mg/dL (ref 0.4–1.2)
GFR: 103.09 mL/min (ref >60.00)
Sodium: 138 mEq/L (ref 135–145)

## 2012-01-07 LAB — LIPID PANEL
Cholesterol: 185 mg/dL (ref 0–200)
HDL: 67.2 mg/dL (ref >39.00)
LDL Cholesterol: 108 mg/dL — ABNORMAL HIGH (ref 0–99)
Total CHOL/HDL Ratio: 3
Triglycerides: 51 mg/dL (ref 0.0–149.0)

## 2012-01-07 LAB — HEPATIC FUNCTION PANEL
Alkaline Phosphatase: 54 U/L (ref 39–117)
Bilirubin, Direct: 0.1 mg/dL (ref 0.0–0.3)

## 2012-01-07 NOTE — Assessment & Plan Note (Signed)
Chronic problem.  Has been maintaining w/ diet and exercise.  Due for labs.

## 2012-01-07 NOTE — Assessment & Plan Note (Signed)
Pt's PE WNL.  Refusing colonoscopy and pap.  Plans to schedule mammo and DEXA.  Check labs.  Anticipatory guidance provided.

## 2012-01-07 NOTE — Patient Instructions (Signed)
Follow up in 6 months- sooner if needed Call your insurance and ask about the shingles shot- in office vs pharmacy We'll notify you of your lab results Call with any questions or concerns You look great! Happy Mother's Day!!!

## 2012-01-07 NOTE — Progress Notes (Signed)
  Subjective:    Patient ID: Shannon Austin, female    DOB: 07-12-1933, 78 y.o.   MRN: [id number]  HPI Here today for CPE.  Risk Factors: HTN- chronic problem, excellent control.  On Nadolol, Diovan, Aldactone.  Denies CP, SOB, HAs, visual changes, edema. Hyperlipidemia- chronic problem, on Fish oil.  Has been controlling w/ diet and exercise. Osteoporosis- chronic problem, on Fosamax.  Due for DEXA.  Plans on scheduling exam. [facility] Fall Risk: very low risk, steady on feet Depression: no current sxs Hearing: decreased to whispered voice but normal to conversational tones ADL's: independent Cognitive: normal linear thought process, memory and attention intact Home Safety: safe at home Height, Weight, BMI, Visual Acuity: see vitals, vision corrected to 20/20 w/ glasses Counseling: overdue on DEXA, mammo (plans to schedule), no paps due to age, has never had colonoscopy- not interested Labs Ordered: See A&P Care Plan: See A&P    Review of Systems Patient reports no vision/ hearing changes, adenopathy,fever, weight change,  persistant/recurrent hoarseness , swallowing issues, chest pain, palpitations, edema, persistant/recurrent cough, hemoptysis, dyspnea (rest/exertional/paroxysmal nocturnal), gastrointestinal bleeding (melena, rectal bleeding), abdominal pain, significant heartburn, bowel changes, GU symptoms (dysuria, hematuria, incontinence), Gyn symptoms (abnormal  bleeding, pain),  syncope, focal weakness, memory loss, numbness & tingling, skin/hair/nail changes, abnormal bruising or bleeding, anxiety, or depression.     Objective:   Physical Exam General Appearance:    Alert, cooperative, no distress, appears stated age  Head:    Normocephalic, without obvious abnormality, atraumatic  Eyes:    PERRL, conjunctiva/corneas clear, EOM's intact, fundi    benign, both eyes  Ears:    Normal TM's and external ear canals, both ears  Nose:    Nares normal, septum midline, mucosa normal, no drainage    or sinus tenderness  Throat:   Lips, mucosa, and tongue normal; teeth and gums normal  Neck:   Supple, symmetrical, trachea midline, no adenopathy;    Thyroid: no enlargement/tenderness/nodules  Back:     Symmetric, no curvature, ROM normal, no CVA tenderness  Lungs:     Clear to auscultation bilaterally, respirations unlabored  Chest Wall:    No tenderness or deformity   Heart:    Regular rate and rhythm, S1 and S2 normal, no murmur, rub   or gallop  Breast Exam:    Deferred  Abdomen:     Soft, non-tender, bowel sounds active all four quadrants,    no masses, no organomegaly  Genitalia:    Deferred at pt's request  Rectal:    Extremities:   Extremities normal, atraumatic, no cyanosis or edema  Pulses:   2+ and symmetric all extremities  Skin:   Skin color, texture, turgor normal, no rashes or lesions  Lymph nodes:   Cervical, supraclavicular, and axillary nodes normal  Neurologic:   CNII-XII intact, normal strength, sensation and reflexes    throughout          Assessment & Plan:

## 2012-01-07 NOTE — Assessment & Plan Note (Signed)
Chronic problem.  On fosamax.  Due for DEXA- plans to schedule.

## 2012-01-07 NOTE — Assessment & Plan Note (Signed)
Chronic problem.  Excellent control.  Asymptomatic.  No changes. 

## 2012-01-07 NOTE — Telephone Encounter (Signed)
Patient called and stated she has scheduled her mammogram & bone density at the [facility] 5.27.13 at 3pm. However they told her she had to get [place] to send them permission to see her. I will assume she needs a referral, can some one call [phone] with a referral or fax one for her & let her know when it is done. Patient ph# [phone]

## 2012-01-08 NOTE — Telephone Encounter (Signed)
Please advise codes for the tests so I can place referral in chart

## 2012-01-11 ENCOUNTER — Encounter: Payer: Self-pay | Admitting: *Deleted

## 2012-01-11 ENCOUNTER — Telehealth: Payer: Self-pay | Admitting: Family Medicine

## 2012-01-11 DIAGNOSIS — Q782 Osteopetrosis: Secondary | ICD-10-CM

## 2012-01-11 DIAGNOSIS — Z1231 Encounter for screening mammogram for malignant neoplasm of breast: Secondary | ICD-10-CM

## 2012-01-11 MED ORDER — ZOSTER VACCINE LIVE 19400 UNT/0.65ML ~~LOC~~ SOLR: 0.6500 mL | Freq: Once | SUBCUTANEOUS | Status: AC

## 2012-01-11 NOTE — Telephone Encounter (Signed)
Please call patient back regarding Shingles vaccination, insurance will pay for it if we give her a prescription to have administered at a Pharmacy  Can call at home or on cell

## 2012-01-11 NOTE — Telephone Encounter (Signed)
Patient calling, states she needs Order for Bone Density.  She has scheduled her appointment for Monday, 01-25-12 at the [facility].  Will you enter Order please?

## 2012-01-11 NOTE — Telephone Encounter (Signed)
Orders placed in chart and forwarded to referral rep Please note pt has an apt set up already but needs to have the order/referral placed in the chart per insurance purposes

## 2012-01-11 NOTE — Telephone Encounter (Signed)
Pt walked in to the office to get her shingles vaccine rx printed and bone density and mammogram referral placed in the chart, printed rx for shingles vaccine given to the pt, pt was advised the referral was placed in the system for her mammo and  Bone density and that someone will call her about these apt times/dates asap, pt understood, placed referrals in chart, MD Tabori made aware verbally

## 2012-01-11 NOTE — Telephone Encounter (Signed)
mammo- v76.12 DEXA- post menopausal or osteoporosis/osteopenia

## 2012-01-12 NOTE — Telephone Encounter (Signed)
Referral faxed to [facility] per patient request.

## 2012-02-18 ENCOUNTER — Telehealth: Payer: Self-pay | Admitting: *Deleted

## 2012-02-18 NOTE — Telephone Encounter (Signed)
Called pt to advise the results from her recent Dexa Scan noted she still has osteoporosis and needs to continue to take Fosamax and MD Beverely Low will continue to monitor, left message to call office to discuss with pt

## 2012-02-18 NOTE — Telephone Encounter (Signed)
Discuss with patient  

## 2012-05-20 ENCOUNTER — Other Ambulatory Visit: Payer: Self-pay | Admitting: Family Medicine

## 2012-05-20 MED ORDER — SPIRONOLACTONE 25 MG PO TABS: 25.0000 mg | ORAL_TABLET | Freq: Two times a day (BID) | ORAL | Status: DC

## 2012-05-20 MED ORDER — VALSARTAN 160 MG PO TABS: 160.0000 mg | ORAL_TABLET | Freq: Every day | ORAL | Status: DC

## 2012-05-20 NOTE — Telephone Encounter (Signed)
RX sent

## 2012-05-20 NOTE — Telephone Encounter (Signed)
Refills x 2 last ov V70 wt/labs 5.9.13 **requesting 90-day supply   1-Diovan tab 160mg  tablet. Take one by mouth daily Last written 3.1.13 #90 wt/1-refill  2-Spironolactone (Tab) 25 MG Take 1 tablet (25 mg total) by mouth 2 (two) times daily Last written 3.1.13 #180 wt/1-refill

## 2012-07-11 ENCOUNTER — Ambulatory Visit: Payer: Medicare Other | Admitting: Family Medicine

## 2012-07-12 ENCOUNTER — Telehealth: Payer: Self-pay | Admitting: Family Medicine

## 2012-07-12 MED ORDER — NADOLOL 20 MG PO TABS: 20.0000 mg | ORAL_TABLET | Freq: Every day | ORAL | Status: DC

## 2012-07-12 MED ORDER — SPIRONOLACTONE 25 MG PO TABS: 25.0000 mg | ORAL_TABLET | Freq: Two times a day (BID) | ORAL | Status: DC

## 2012-07-12 NOTE — Telephone Encounter (Signed)
Rx sent, Pt already has pending appt for 6 month f/u

## 2012-07-12 NOTE — Telephone Encounter (Signed)
Refill: Nadolol tab 20 mg. Take 1 by mouth daily. 90 day supply  Spironolactone tab 25 mg. Take 1 by mouth twice daily. 90 day supply

## 2012-07-12 NOTE — Telephone Encounter (Signed)
Per 01/07/12 OV note, pt needs a six month follow up appt.

## 2012-07-25 ENCOUNTER — Ambulatory Visit (INDEPENDENT_AMBULATORY_CARE_PROVIDER_SITE_OTHER): Payer: Medicare Other | Admitting: Family Medicine

## 2012-07-25 ENCOUNTER — Encounter: Payer: Self-pay | Admitting: Family Medicine

## 2012-07-25 VITALS — BP 112/68 | HR 69 | Temp 98.3°F | Wt 134.8 lb

## 2012-07-25 DIAGNOSIS — E785 Hyperlipidemia, unspecified: Secondary | ICD-10-CM

## 2012-07-25 DIAGNOSIS — I10 Essential (primary) hypertension: Secondary | ICD-10-CM

## 2012-07-25 LAB — BASIC METABOLIC PANEL
CO2: 29 mEq/L (ref 19–32)
Calcium: 9.6 mg/dL (ref 8.4–10.5)
Chloride: 101 mEq/L (ref 96–112)
Creatinine, Ser: 0.6 mg/dL (ref 0.4–1.2)
Glucose, Bld: 117 mg/dL — ABNORMAL HIGH (ref 70–99)
Sodium: 137 mEq/L (ref 135–145)

## 2012-07-25 LAB — HEPATIC FUNCTION PANEL
ALT: 20 U/L (ref 0–35)
Albumin: 4 g/dL (ref 3.5–5.2)
Alkaline Phosphatase: 47 U/L (ref 39–117)
Bilirubin, Direct: 0 mg/dL (ref 0.0–0.3)
Total Protein: 7.1 g/dL (ref 6.0–8.3)

## 2012-07-25 LAB — LIPID PANEL
HDL: 64.1 mg/dL (ref >39.00)
Total CHOL/HDL Ratio: 3

## 2012-07-25 MED ORDER — VALSARTAN 160 MG PO TABS: 160.0000 mg | ORAL_TABLET | Freq: Every day | ORAL | Status: DC

## 2012-07-25 MED ORDER — SPIRONOLACTONE 25 MG PO TABS: 25.0000 mg | ORAL_TABLET | Freq: Two times a day (BID) | ORAL | Status: DC

## 2012-07-25 MED ORDER — NADOLOL 20 MG PO TABS: 20.0000 mg | ORAL_TABLET | Freq: Every day | ORAL | Status: DC

## 2012-07-25 NOTE — Patient Instructions (Addendum)
Schedule your complete physical in 6 months You look fantastic!  Keep up the good work! We'll notify you of your lab results Call with any questions or concerns Happy Holidays!!!

## 2012-07-25 NOTE — Assessment & Plan Note (Signed)
Chronic problem, excellent control.  Asymptomatic.  No anticipated changes. 

## 2012-07-25 NOTE — Assessment & Plan Note (Signed)
Chronic problem, typically well controlled w/ diet and exercise.  Mother is 76 yrs old and doing well!!  Check labs.  Start meds prn

## 2012-07-25 NOTE — Progress Notes (Signed)
  Subjective:    Patient ID: Shannon Austin, female    DOB: 1932-12-10, 78 y.o.   MRN: [id number]  HPI HTN- chronic problem, on nadolol, spironolactone, diovan.  Denies CP, SOB, HAs, visual changes, edema.  Hyperlipidemia- chronic problem, currently on Fish Oil.  Controls w/ diet and exercise.   Review of Systems For ROS see HPI     Objective:   Physical Exam  Vitals reviewed. Constitutional: She is oriented to person, place, and time. She appears well-developed and well-nourished. No distress.  HENT:  Head: Normocephalic and atraumatic.  Eyes: Conjunctivae normal and EOM are normal. Pupils are equal, round, and reactive to light.  Neck: Normal range of motion. Neck supple. No thyromegaly present.  Cardiovascular: Normal rate, regular rhythm, normal heart sounds and intact distal pulses.   No murmur heard. Pulmonary/Chest: Effort normal and breath sounds normal. No respiratory distress.  Abdominal: Soft. She exhibits no distension. There is no tenderness.  Musculoskeletal: She exhibits no edema.  Lymphadenopathy:    She has no cervical adenopathy.  Neurological: She is alert and oriented to person, place, and time.  Skin: Skin is warm and dry.  Psychiatric: She has a normal mood and affect. Her behavior is normal.          Assessment & Plan:

## 2012-07-26 ENCOUNTER — Telehealth: Payer: Self-pay | Admitting: Family Medicine

## 2012-07-26 NOTE — Telephone Encounter (Signed)
Message copied by Marshell Garfinkel on Tue Jul 26, 2012  3:17 PM ------      Message from: Elsa, [place]      Created: Tue Jul 26, 2012 10:23 AM      Regarding: lab appt        Beverely Low wants A1C on pt and Sheena couldn't add to labs. Need pt to come back in just for A1C lab appt. PLz call pt and schedule appt.

## 2012-07-26 NOTE — Telephone Encounter (Signed)
Lmovm for pt to call office. °

## 2012-07-27 NOTE — Telephone Encounter (Signed)
Pt LMOVM stating returning call concerning labs no more info given. After reviewing pt chart I saw [place] called to schedule appt.  PLz advise pt CB# [phone] MW

## 2012-07-27 NOTE — Telephone Encounter (Signed)
Spoke w/pt. She did not want to come back for A1c at this time. She will call in December to schedule A1c.

## 2012-09-19 ENCOUNTER — Telehealth: Payer: Self-pay | Admitting: *Deleted

## 2012-09-19 DIAGNOSIS — I10 Essential (primary) hypertension: Secondary | ICD-10-CM

## 2012-09-19 MED ORDER — LOSARTAN POTASSIUM 100 MG PO TABS: 100.0000 mg | ORAL_TABLET | Freq: Every day | ORAL | Status: DC

## 2012-09-19 NOTE — Telephone Encounter (Signed)
Rx for Cozaar sent [facility], pt advised to f/u in office in 1 month for

## 2012-09-19 NOTE — Telephone Encounter (Signed)
Patient left message on triage line, states insurance no longer covers Diovan and she can't afford to pay out of pocket. They have suggested the following meds for substitution; Atacand, Cozaar, Micardis or Avapro. Please advise. She has 2 weeks left of current Diovan script. Thanks.

## 2012-09-19 NOTE — Telephone Encounter (Signed)
Ok for Cozaar 100mg  daily, #90, 1 refill.  Needs OV 1 month after starting new med to make sure BP is well controlled

## 2012-09-19 NOTE — Addendum Note (Signed)
Addended by: Baldwin [place] on: 09/19/2012 03:19 PM   Modules accepted: Orders

## 2013-05-03 ENCOUNTER — Ambulatory Visit (INDEPENDENT_AMBULATORY_CARE_PROVIDER_SITE_OTHER): Payer: Medicare Other | Admitting: Family Medicine

## 2013-05-03 ENCOUNTER — Encounter: Payer: Self-pay | Admitting: Family Medicine

## 2013-05-03 VITALS — BP 118/70 | HR 71 | Temp 98.7°F | Ht 58.75 in | Wt 133.0 lb

## 2013-05-03 DIAGNOSIS — Z0000 Encounter for general adult medical examination without abnormal findings: Secondary | ICD-10-CM

## 2013-05-03 DIAGNOSIS — I10 Essential (primary) hypertension: Secondary | ICD-10-CM

## 2013-05-03 DIAGNOSIS — M810 Age-related osteoporosis without current pathological fracture: Secondary | ICD-10-CM

## 2013-05-03 DIAGNOSIS — E785 Hyperlipidemia, unspecified: Secondary | ICD-10-CM

## 2013-05-03 LAB — CBC WITH DIFFERENTIAL/PLATELET
Basophils Relative: 0.3 % (ref 0.0–3.0)
Eosinophils Relative: 1.7 % (ref 0.0–5.0)
HCT: 44 % (ref 36.0–46.0)
Hemoglobin: 14.9 g/dL (ref 12.0–15.0)
Lymphs Abs: 1.4 10*3/uL (ref 0.7–4.0)
MCV: 97.7 fl (ref 78.0–100.0)
Monocytes Absolute: 0.7 10*3/uL (ref 0.1–1.0)
Monocytes Relative: 8.8 % (ref 3.0–12.0)
Neutro Abs: 6 10*3/uL (ref 1.4–7.7)
Platelets: 218 10*3/uL (ref 150.0–400.0)
RBC: 4.51 Mil/uL (ref 3.87–5.11)
WBC: 8.3 10*3/uL (ref 4.5–10.5)

## 2013-05-03 LAB — LDL CHOLESTEROL, DIRECT: Direct LDL: 125.8 mg/dL

## 2013-05-03 LAB — BASIC METABOLIC PANEL
BUN: 20 mg/dL (ref 6–23)
CO2: 25 mEq/L (ref 19–32)
Calcium: 9.5 mg/dL (ref 8.4–10.5)
Creatinine, Ser: 0.7 mg/dL (ref 0.4–1.2)
GFR: 85.99 mL/min (ref >60.00)
Glucose, Bld: 103 mg/dL — ABNORMAL HIGH (ref 70–99)

## 2013-05-03 LAB — HEPATIC FUNCTION PANEL
Albumin: 4.1 g/dL (ref 3.5–5.2)
Alkaline Phosphatase: 46 U/L (ref 39–117)
Total Protein: 7.1 g/dL (ref 6.0–8.3)

## 2013-05-03 LAB — LIPID PANEL
HDL: 61.5 mg/dL (ref >39.00)
Triglycerides: 52 mg/dL (ref 0.0–149.0)

## 2013-05-03 LAB — TSH: TSH: 1.32 u[IU]/mL (ref 0.35–5.50)

## 2013-05-03 NOTE — Assessment & Plan Note (Signed)
Pt's PE WNL.  UTD on mammo and DEXA.  Pt declines colonoscopy.  Check labs.  Anticipatory guidance provided.

## 2013-05-03 NOTE — Assessment & Plan Note (Signed)
Chronic problem.  On Fosamax, Ca and Vit D.  Check Vit D level, replete PRN.

## 2013-05-03 NOTE — Patient Instructions (Addendum)
Follow up in 6 months to recheck BP and cholesterol We'll notify you of your lab results and make any changes if needed Keep up the good work!  You look great!!!

## 2013-05-03 NOTE — Progress Notes (Signed)
  Subjective:    Patient ID: Shannon Austin, female    DOB: Jan 01, 1933, 79 y.o.   MRN: [id number]  HPI Here today for CPE.  Risk Factors: HTN- chronic problem, excellent control on Losartan, spironolactone, nadolol. Hyperlipidemia- chronic problem, on fish oil.  Statin and zetia intolerant. Osteoporosis- chronic problem, UTD on DEXA.  On Ca + Vit D. Physical Activity: exercising regularly Fall Risk: low risk Depression: denies current sxs Hearing: normal to conversational tones, decreased to whispered voice ADL's: independent Cognitive: normal linear thought process, memory and attention intact Home Safety: safe at home Height, Weight, BMI, Visual Acuity: see vitals, vision corrected to 20/20 w/ glasses Counseling:  UTD on DEXA and mammo (2013), not interested in colonoscopy, no need for paps Labs Ordered: See A&P Care Plan: See A&P    Review of Systems Patient reports no vision/ hearing changes, adenopathy,fever, weight change,  persistant/recurrent hoarseness , swallowing issues, chest pain, palpitations, edema, persistant/recurrent cough, hemoptysis, dyspnea (rest/exertional/paroxysmal nocturnal), gastrointestinal bleeding (melena, rectal bleeding), abdominal pain, significant heartburn, bowel changes, Gyn symptoms (abnormal  bleeding, pain),  syncope, focal weakness, memory loss, numbness & tingling, skin/hair/nail changes, abnormal bruising or bleeding, anxiety, or depression.  + hematuria- following w/ [facility] ([facility])     Objective:   Physical Exam General Appearance:    Alert, cooperative, no distress, appears stated age  Head:    Normocephalic, without obvious abnormality, atraumatic  Eyes:    PERRL, conjunctiva/corneas clear, EOM's intact, fundi    benign, both eyes  Ears:    Normal TM's and external ear canals, both ears  Nose:   Nares normal, septum midline, mucosa normal, no drainage    or sinus tenderness  Throat:   Lips, mucosa, and tongue normal; teeth and gums  normal  Neck:   Supple, symmetrical, trachea midline, no adenopathy;    Thyroid: no enlargement/tenderness/nodules  Back:     Symmetric, no curvature, ROM normal, no CVA tenderness  Lungs:     Clear to auscultation bilaterally, respirations unlabored  Chest Wall:    No tenderness or deformity   Heart:    Regular rate and rhythm, S1 and S2 normal, no murmur, rub   or gallop  Breast Exam:    Deferred to mammo  Abdomen:     Soft, non-tender, bowel sounds active all four quadrants,    no masses, no organomegaly  Genitalia:    Deferred at pt's request  Rectal:    Extremities:   Extremities normal, atraumatic, no cyanosis or edema  Pulses:   2+ and symmetric all extremities  Skin:   Skin color, texture, turgor normal, no rashes or lesions  Lymph nodes:   Cervical, supraclavicular, and axillary nodes normal  Neurologic:   CNII-XII intact, normal strength, sensation and reflexes    throughout          Assessment & Plan:

## 2013-05-03 NOTE — Assessment & Plan Note (Signed)
Chronic problem.  Intolerant to meds.  Check labs.  Only other option at this time would be Welchol.  Will follow.

## 2013-05-03 NOTE — Assessment & Plan Note (Signed)
Chronic problem.  Adequate control.  Asymptomatic.  Check labs.  No anticipated med changes 

## 2013-05-04 ENCOUNTER — Other Ambulatory Visit: Payer: Self-pay | Admitting: *Deleted

## 2013-05-04 DIAGNOSIS — I10 Essential (primary) hypertension: Secondary | ICD-10-CM

## 2013-05-04 MED ORDER — ALENDRONATE SODIUM 70 MG PO TABS: 70.0000 mg | ORAL_TABLET | ORAL | Status: DC

## 2013-05-04 MED ORDER — SPIRONOLACTONE 25 MG PO TABS: 25.0000 mg | ORAL_TABLET | Freq: Two times a day (BID) | ORAL | Status: DC

## 2013-05-04 MED ORDER — LOSARTAN POTASSIUM 100 MG PO TABS: 100.0000 mg | ORAL_TABLET | Freq: Every day | ORAL | Status: DC

## 2013-05-05 ENCOUNTER — Encounter: Payer: Self-pay | Admitting: General Practice

## 2013-05-07 LAB — VITAMIN D 1,25 DIHYDROXY: Vitamin D 1, 25 (OH)2 Total: 40 pg/mL (ref 18–72)

## 2013-05-08 ENCOUNTER — Encounter: Payer: Self-pay | Admitting: General Practice

## 2013-05-10 ENCOUNTER — Other Ambulatory Visit: Payer: Self-pay | Admitting: *Deleted

## 2013-05-10 MED ORDER — NADOLOL 20 MG PO TABS: 20.0000 mg | ORAL_TABLET | Freq: Every day | ORAL | Status: DC

## 2013-05-10 NOTE — Telephone Encounter (Signed)
Rx was refilled for nadolol.  ag cma

## 2013-05-23 ENCOUNTER — Encounter: Payer: Self-pay | Admitting: *Deleted

## 2013-09-08 ENCOUNTER — Other Ambulatory Visit: Payer: Self-pay | Admitting: General Practice

## 2013-09-08 DIAGNOSIS — I10 Essential (primary) hypertension: Secondary | ICD-10-CM

## 2013-09-08 MED ORDER — LOSARTAN POTASSIUM 100 MG PO TABS
100.0000 mg | ORAL_TABLET | Freq: Every day | ORAL | Status: DC
Start: 2013-09-08 — End: 2013-09-11

## 2013-09-08 MED ORDER — SPIRONOLACTONE 25 MG PO TABS: 25.0000 mg | ORAL_TABLET | Freq: Two times a day (BID) | ORAL | Status: DC

## 2013-09-08 MED ORDER — NADOLOL 20 MG PO TABS: 20.0000 mg | ORAL_TABLET | Freq: Every day | ORAL | Status: DC

## 2013-09-08 MED ORDER — ALENDRONATE SODIUM 70 MG PO TABS: 70.0000 mg | ORAL_TABLET | ORAL | Status: DC

## 2013-09-11 ENCOUNTER — Telehealth: Payer: Self-pay | Admitting: *Deleted

## 2013-09-11 DIAGNOSIS — I10 Essential (primary) hypertension: Secondary | ICD-10-CM

## 2013-09-11 MED ORDER — NADOLOL 20 MG PO TABS: 20.0000 mg | ORAL_TABLET | Freq: Every day | ORAL | Status: DC

## 2013-09-11 MED ORDER — LOSARTAN POTASSIUM 100 MG PO TABS: 100.0000 mg | ORAL_TABLET | Freq: Every day | ORAL | Status: DC

## 2013-09-11 MED ORDER — SPIRONOLACTONE 25 MG PO TABS: 25.0000 mg | ORAL_TABLET | Freq: Two times a day (BID) | ORAL | Status: DC

## 2013-09-11 NOTE — Telephone Encounter (Signed)
09/11/2013 Pt came by office this morning.  Last week, the new mail order sent our office information to change her prescriptions to [facility].  Our office sent all her updated prescriptions to the old mail order ([facility] [employer]).  Please update the information and send all prescriptions to [facility].  Thanks.  bw

## 2013-09-11 NOTE — Telephone Encounter (Signed)
Meds refilled to [facility].

## 2013-11-01 ENCOUNTER — Ambulatory Visit: Payer: Medicare Other | Admitting: Family Medicine

## 2013-11-09 ENCOUNTER — Ambulatory Visit: Payer: Commercial Managed Care - HMO | Admitting: Family Medicine

## 2013-11-27 ENCOUNTER — Telehealth: Payer: Self-pay | Admitting: *Deleted

## 2013-11-27 DIAGNOSIS — R300 Dysuria: Secondary | ICD-10-CM

## 2013-11-27 NOTE — Telephone Encounter (Signed)
Pt came by office.  Went to see Dr. Milana Na, MD, F.A.C.S. And was told she needs a referral to be seen.  She has appt scheduled for 12/11/2013.  Please advise.  bw

## 2013-11-27 NOTE — Telephone Encounter (Signed)
Please enter referral for pt (she has been seeing him for years- he's her urologist)

## 2013-11-27 NOTE — Telephone Encounter (Signed)
Referral placed.

## 2013-11-27 NOTE — Telephone Encounter (Signed)
Med filled.  

## 2014-01-26 LAB — HM MAMMOGRAPHY

## 2014-01-29 ENCOUNTER — Encounter: Payer: Self-pay | Admitting: General Practice

## 2014-04-25 ENCOUNTER — Encounter: Payer: Self-pay | Admitting: Family Medicine

## 2014-04-25 ENCOUNTER — Ambulatory Visit (INDEPENDENT_AMBULATORY_CARE_PROVIDER_SITE_OTHER): Payer: Commercial Managed Care - HMO | Admitting: Family Medicine

## 2014-04-25 VITALS — BP 122/82 | HR 76 | Temp 98.0°F | Resp 16 | Wt 125.0 lb

## 2014-04-25 DIAGNOSIS — I10 Essential (primary) hypertension: Secondary | ICD-10-CM

## 2014-04-25 DIAGNOSIS — E785 Hyperlipidemia, unspecified: Secondary | ICD-10-CM

## 2014-04-25 DIAGNOSIS — H6123 Impacted cerumen, bilateral: Secondary | ICD-10-CM

## 2014-04-25 DIAGNOSIS — H6120 Impacted cerumen, unspecified ear: Secondary | ICD-10-CM

## 2014-04-25 DIAGNOSIS — M810 Age-related osteoporosis without current pathological fracture: Secondary | ICD-10-CM

## 2014-04-25 LAB — BASIC METABOLIC PANEL
BUN: 14 mg/dL (ref 6–23)
CALCIUM: 9.2 mg/dL (ref 8.4–10.5)
CO2: 26 mEq/L (ref 19–32)
CREATININE: 0.7 mg/dL (ref 0.4–1.2)
Chloride: 101 mEq/L (ref 96–112)
GFR: 91.8 mL/min (ref >60.00)
Glucose, Bld: 109 mg/dL — ABNORMAL HIGH (ref 70–99)
Potassium: 3.6 mEq/L (ref 3.5–5.1)
Sodium: 136 mEq/L (ref 135–145)

## 2014-04-25 LAB — CBC WITH DIFFERENTIAL/PLATELET
BASOS PCT: 0.3 % (ref 0.0–3.0)
Basophils Absolute: 0 10*3/uL (ref 0.0–0.1)
EOS PCT: 2 % (ref 0.0–5.0)
Eosinophils Absolute: 0.1 10*3/uL (ref 0.0–0.7)
HEMATOCRIT: 41.5 % (ref 36.0–46.0)
HEMOGLOBIN: 14 g/dL (ref 12.0–15.0)
LYMPHS ABS: 1.4 10*3/uL (ref 0.7–4.0)
LYMPHS PCT: 20.8 % (ref 12.0–46.0)
MCHC: 33.7 g/dL (ref 30.0–36.0)
MCV: 97.2 fl (ref 78.0–100.0)
MONOS PCT: 11.2 % (ref 3.0–12.0)
Monocytes Absolute: 0.8 10*3/uL (ref 0.1–1.0)
Neutro Abs: 4.4 10*3/uL (ref 1.4–7.7)
Neutrophils Relative %: 65.7 % (ref 43.0–77.0)
Platelets: 218 10*3/uL (ref 150.0–400.0)
RBC: 4.27 Mil/uL (ref 3.87–5.11)
RDW: 14.8 % (ref 11.5–15.5)
WBC: 6.7 10*3/uL (ref 4.0–10.5)

## 2014-04-25 LAB — LIPID PANEL
Cholesterol: 190 mg/dL (ref 0–200)
HDL: 62 mg/dL (ref >39.00)
LDL Cholesterol: 115 mg/dL — ABNORMAL HIGH (ref 0–99)
NonHDL: 128
TRIGLYCERIDES: 67 mg/dL (ref 0.0–149.0)
Total CHOL/HDL Ratio: 3
VLDL: 13.4 mg/dL (ref 0.0–40.0)

## 2014-04-25 LAB — HEPATIC FUNCTION PANEL
ALT: 14 U/L (ref 0–35)
AST: 22 U/L (ref 0–37)
Albumin: 3.8 g/dL (ref 3.5–5.2)
Alkaline Phosphatase: 53 U/L (ref 39–117)
Bilirubin, Direct: 0.1 mg/dL (ref 0.0–0.3)
Total Bilirubin: 1 mg/dL (ref 0.2–1.2)
Total Protein: 6.9 g/dL (ref 6.0–8.3)

## 2014-04-25 NOTE — Progress Notes (Signed)
Pre visit review using our clinic review tool, if applicable. No additional management support is needed unless otherwise documented below in the visit note. 

## 2014-04-25 NOTE — Assessment & Plan Note (Signed)
Chronic problem, intolerant to statins and Zetia.  Attempting to control w/ healthy diet.  Check labs.  Will follow.

## 2014-04-25 NOTE — Assessment & Plan Note (Signed)
Chronic problem, due for DEXA.  Order entered.

## 2014-04-25 NOTE — Assessment & Plan Note (Signed)
Chronic problem, well controlled.  Asymptomatic.  Check labs.  No anticipated med changes. °

## 2014-04-25 NOTE — Patient Instructions (Signed)
Schedule your complete physical in 6 months We'll notify you of your lab results and make any changes if needed We'll call you with your Bone Density appt Call with any questions or concerns Enjoy the beach!

## 2014-04-25 NOTE — Progress Notes (Signed)
   Subjective:    Patient ID: Shannon Austin, female    DOB: Jan 10, 1933, 80 y.o.   MRN: [id number]  HPI HTN- chronic problem, on Losartan, Nadolol, Spironolactone.  No CP, SOB, HAs, visual changes, edema.  Exercising regularly  Hyperlipidemia- chronic problem, pt is intolerant to statin and Zetia.  Pt is attempting to control w/ healthy diet.  Denies abd pain, N/V, myalgias  Osteoporosis- chronic problem, on Fosamax and Ca.  Due for DEXA.  Needs ENT referral for Dr Laurance Flatten at [place] see HPI     Objective:   Physical Exam  Vitals reviewed. Constitutional: She is oriented to person, place, and time. She appears well-developed and well-nourished. No distress.  HENT:  Head: Normocephalic and atraumatic.  Eyes: Conjunctivae and EOM are normal. Pupils are equal, round, and reactive to light.  Neck: Normal range of motion. Neck supple. No thyromegaly present.  Cardiovascular: Normal rate, regular rhythm, normal heart sounds and intact distal pulses.   No murmur heard. Pulmonary/Chest: Effort normal and breath sounds normal. No respiratory distress.  Abdominal: Soft. She exhibits no distension. There is no tenderness.  Musculoskeletal: She exhibits no edema.  Lymphadenopathy:    She has no cervical adenopathy.  Neurological: She is alert and oriented to person, place, and time.  Skin: Skin is warm and dry.  Psychiatric: She has a normal mood and affect. Her behavior is normal.          Assessment & Plan:

## 2014-04-26 ENCOUNTER — Encounter: Payer: Self-pay | Admitting: General Practice

## 2014-06-29 ENCOUNTER — Other Ambulatory Visit: Payer: Self-pay | Admitting: General Practice

## 2014-06-29 MED ORDER — NADOLOL 20 MG PO TABS: 20.0000 mg | ORAL_TABLET | Freq: Every day | ORAL | Status: DC

## 2014-06-29 MED ORDER — LOSARTAN POTASSIUM 100 MG PO TABS: 100.0000 mg | ORAL_TABLET | Freq: Every day | ORAL | Status: DC

## 2014-09-14 ENCOUNTER — Other Ambulatory Visit: Payer: Self-pay | Admitting: Family Medicine

## 2014-09-14 NOTE — Telephone Encounter (Signed)
Med filled.  

## 2014-10-11 ENCOUNTER — Telehealth: Payer: Self-pay | Admitting: Family Medicine

## 2014-10-11 DIAGNOSIS — H6123 Impacted cerumen, bilateral: Secondary | ICD-10-CM

## 2014-10-11 DIAGNOSIS — R319 Hematuria, unspecified: Secondary | ICD-10-CM

## 2014-10-11 DIAGNOSIS — R300 Dysuria: Secondary | ICD-10-CM

## 2014-10-11 NOTE — Telephone Encounter (Signed)
Caller name: Alix Relation to pt: self Call back number: [phone] Pharmacy:  Reason for call:   Requesting two referrals  1. Needs referral to Dr. Laurance Flatten at [facility] for ear cleaning. Would like to schedule her own appointment.   2. Needs referral to Dr. Harlow Asa at [facility] urology? For bladder problems. Has appointment in early April.  This is because of [employer]

## 2014-10-11 NOTE — Telephone Encounter (Signed)
done

## 2014-10-11 NOTE — Telephone Encounter (Signed)
Referrals placed, due to [facility]

## 2014-10-29 ENCOUNTER — Encounter: Payer: Self-pay | Admitting: *Deleted

## 2014-10-29 ENCOUNTER — Telehealth: Payer: Self-pay | Admitting: *Deleted

## 2014-10-29 NOTE — Telephone Encounter (Signed)
Pre-Visit Call completed, chart updated.    Pre-Visit Info left in Specialty Comments in [employer].

## 2014-10-31 ENCOUNTER — Encounter: Payer: Self-pay | Admitting: General Practice

## 2014-10-31 ENCOUNTER — Encounter: Payer: Self-pay | Admitting: Family Medicine

## 2014-10-31 ENCOUNTER — Ambulatory Visit (INDEPENDENT_AMBULATORY_CARE_PROVIDER_SITE_OTHER): Payer: Commercial Managed Care - HMO | Admitting: Family Medicine

## 2014-10-31 VITALS — BP 120/78 | HR 67 | Temp 97.9°F | Resp 16 | Ht 58.75 in | Wt 124.5 lb

## 2014-10-31 DIAGNOSIS — Z23 Encounter for immunization: Secondary | ICD-10-CM

## 2014-10-31 DIAGNOSIS — D239 Other benign neoplasm of skin, unspecified: Secondary | ICD-10-CM

## 2014-10-31 DIAGNOSIS — Z1231 Encounter for screening mammogram for malignant neoplasm of breast: Secondary | ICD-10-CM

## 2014-10-31 DIAGNOSIS — M810 Age-related osteoporosis without current pathological fracture: Secondary | ICD-10-CM

## 2014-10-31 DIAGNOSIS — E785 Hyperlipidemia, unspecified: Secondary | ICD-10-CM

## 2014-10-31 DIAGNOSIS — D229 Melanocytic nevi, unspecified: Secondary | ICD-10-CM

## 2014-10-31 DIAGNOSIS — Z0000 Encounter for general adult medical examination without abnormal findings: Secondary | ICD-10-CM

## 2014-10-31 DIAGNOSIS — I10 Essential (primary) hypertension: Secondary | ICD-10-CM

## 2014-10-31 LAB — CBC WITH DIFFERENTIAL/PLATELET
Basophils Absolute: 0 10*3/uL (ref 0.0–0.1)
Basophils Relative: 0.2 % (ref 0.0–3.0)
EOS PCT: 1.9 % (ref 0.0–5.0)
Eosinophils Absolute: 0.2 10*3/uL (ref 0.0–0.7)
HEMATOCRIT: 44.3 % (ref 36.0–46.0)
Hemoglobin: 15.2 g/dL — ABNORMAL HIGH (ref 12.0–15.0)
LYMPHS ABS: 1.6 10*3/uL (ref 0.7–4.0)
LYMPHS PCT: 19.1 % (ref 12.0–46.0)
MCHC: 34.4 g/dL (ref 30.0–36.0)
MCV: 96.7 fl (ref 78.0–100.0)
MONOS PCT: 9.9 % (ref 3.0–12.0)
Monocytes Absolute: 0.8 10*3/uL (ref 0.1–1.0)
NEUTROS ABS: 5.8 10*3/uL (ref 1.4–7.7)
Neutrophils Relative %: 68.9 % (ref 43.0–77.0)
PLATELETS: 231 10*3/uL (ref 150.0–400.0)
RBC: 4.57 Mil/uL (ref 3.87–5.11)
RDW: 13.9 % (ref 11.5–15.5)
WBC: 8.4 10*3/uL (ref 4.0–10.5)

## 2014-10-31 LAB — LIPID PANEL
Cholesterol: 190 mg/dL (ref 0–200)
HDL: 63.1 mg/dL (ref >39.00)
LDL Cholesterol: 114 mg/dL — ABNORMAL HIGH (ref 0–99)
NonHDL: 126.9
TRIGLYCERIDES: 65 mg/dL (ref 0.0–149.0)
Total CHOL/HDL Ratio: 3
VLDL: 13 mg/dL (ref 0.0–40.0)

## 2014-10-31 LAB — VITAMIN D 25 HYDROXY (VIT D DEFICIENCY, FRACTURES): VITD: 39.42 ng/mL (ref 30.00–100.00)

## 2014-10-31 LAB — BASIC METABOLIC PANEL
BUN: 22 mg/dL (ref 6–23)
CHLORIDE: 102 meq/L (ref 96–112)
CO2: 24 mEq/L (ref 19–32)
CREATININE: 0.65 mg/dL (ref 0.40–1.20)
Calcium: 9.7 mg/dL (ref 8.4–10.5)
GFR: 93.31 mL/min (ref >60.00)
Glucose, Bld: 113 mg/dL — ABNORMAL HIGH (ref 70–99)
POTASSIUM: 4.1 meq/L (ref 3.5–5.1)
Sodium: 135 mEq/L (ref 135–145)

## 2014-10-31 LAB — HEPATIC FUNCTION PANEL
ALT: 15 U/L (ref 0–35)
AST: 18 U/L (ref 0–37)
Albumin: 4.2 g/dL (ref 3.5–5.2)
Alkaline Phosphatase: 70 U/L (ref 39–117)
Bilirubin, Direct: 0.2 mg/dL (ref 0.0–0.3)
Total Bilirubin: 0.7 mg/dL (ref 0.2–1.2)
Total Protein: 7.5 g/dL (ref 6.0–8.3)

## 2014-10-31 LAB — TSH: TSH: 1.57 u[IU]/mL (ref 0.35–4.50)

## 2014-10-31 NOTE — Assessment & Plan Note (Signed)
Chronic problem.  Intolerant to statin and zetia.  On fish oil and attempting to control w/ healthy diet and regular exercise.  Check labs.

## 2014-10-31 NOTE — Patient Instructions (Signed)
Follow up in 6 months to recheck BP and lipids We'll notify you of your lab results and make any changes if needed We'll call you with your dermatology appt and your bone density/mammo appts Try and get regular exercise Call with any questions or concerns Keep up the good work!  You look great!

## 2014-10-31 NOTE — Assessment & Plan Note (Signed)
New.  Noted on L neck behind ear.  Referral to derm placed.

## 2014-10-31 NOTE — Progress Notes (Signed)
   Subjective:    Patient ID: Shannon Austin, female    DOB: 03/03/1933, 81 y.o.   MRN: [id number]  HPI Here today for CPE.  Risk Factors: HTN- chronic problem, on Aldactone, Losartan, Nadolol.  No CP, SOB, HAs, visual changes, edema. Hyperlipidemia- chronic problem, allergic to Zetia and statins.  Currently on Fish Oil.   Osteoporosis- due for DEXA.  Not currently taking Fosamax. Physical Activity: very active but no formal exercise Fall Risk: low Depression: denies current sxs Hearing: 'i'm deaf as a post'. ADL's: independent Cognitive: normal linear thought process. Home Safety: safe at home, has dog. Height, Weight, BMI, Visual Acuity: see vitals, vision corrected to 20/20 w/ glasses Counseling: UTD on mammo [facility]).  Due for DEXA, colonoscopy- 'i'll have to think about it.  I really don't care to'. Labs Ordered: See A&P Care Plan: See A&P    Review of Systems Patient reports no vision/ hearing changes, adenopathy,fever, weight change,  persistant/recurrent hoarseness , swallowing issues, chest pain, palpitations, edema, persistant/recurrent cough, hemoptysis, dyspnea (rest/exertional/paroxysmal nocturnal), gastrointestinal bleeding (melena, rectal bleeding), abdominal pain, significant heartburn, bowel changes, GU symptoms (dysuria, hematuria, incontinence), Gyn symptoms (abnormal  bleeding, pain),  syncope, focal weakness, memory loss, numbness & tingling, skin/hair/nail changes, abnormal bruising or bleeding, anxiety, or depression.     Objective:   Physical Exam General Appearance:    Alert, cooperative, no distress, appears stated age  Head:    Normocephalic, without obvious abnormality, atraumatic  Eyes:    PERRL, conjunctiva/corneas clear, EOM's intact, fundi    benign, both eyes  Ears:    Normal TM's and external ear canals, both ears  Nose:   Nares normal, septum midline, mucosa normal, no drainage    or sinus tenderness  Throat:   Lips, mucosa, and tongue normal;  teeth and gums normal  Neck:   Supple, symmetrical, trachea midline, no adenopathy;    Thyroid: no enlargement/tenderness/nodules  Back:     Symmetric, no curvature, ROM normal, no CVA tenderness  Lungs:     Clear to auscultation bilaterally, respirations unlabored  Chest Wall:    No tenderness or deformity   Heart:    Regular rate and rhythm, S1 and S2 normal, no murmur, rub   or gallop  Breast Exam:    Deferred to mammo  Abdomen:     Soft, non-tender, bowel sounds active all four quadrants,    no masses, no organomegaly  Genitalia:    Deferred  Rectal:    Extremities:   Extremities normal, atraumatic, no cyanosis or edema  Pulses:   2+ and symmetric all extremities  Skin:   Skin color, texture, turgor normal, no rashes or lesions.  Speckled mole behind L ear  Lymph nodes:   Cervical, supraclavicular, and axillary nodes normal  Neurologic:   CNII-XII intact, normal strength, sensation and reflexes    throughout          Assessment & Plan:

## 2014-10-31 NOTE — Assessment & Plan Note (Signed)
Chronic problem.  Stopped Fosamax.  Due for DEXA.  Order entered.

## 2014-10-31 NOTE — Assessment & Plan Note (Signed)
Pt's PE WNL.  UTD on mammo.  Due for dexa- order entered- and colonoscopy- pt declines.  Written screening schedule updated and given to pt.  Check labs.  Reviewed healthy diet, regular exercise, home safety.

## 2014-10-31 NOTE — Progress Notes (Signed)
Pre visit review using our clinic review tool, if applicable. No additional management support is needed unless otherwise documented below in the visit note. 

## 2014-10-31 NOTE — Assessment & Plan Note (Signed)
Chronic problem.  Well controlled.  Asymptomatic.  Check labs.  No anticipated med changes. 

## 2015-01-24 ENCOUNTER — Other Ambulatory Visit: Payer: Self-pay | Admitting: Family Medicine

## 2015-02-28 ENCOUNTER — Telehealth: Payer: Self-pay | Admitting: Family Medicine

## 2015-02-28 NOTE — Telephone Encounter (Signed)
Orders faxed, pt aware 

## 2015-02-28 NOTE — Telephone Encounter (Signed)
Orders were placed per provider 10/2014. Printed and placed for provider to sign, will bring up to [facility]. Will you call pt when they are faxed?

## 2015-02-28 NOTE — Telephone Encounter (Signed)
Caller name: Tamkia Temples Relationship to patient: self Can be reached: [phone]  Reason for call: Pt said she is going to call to schedule a mammogram and would like [place] to order bone density scan as well. She would like it sent to [facility]. Please let pt know when order is in. Ok to leave msg on her home #.

## 2015-04-05 LAB — HM DEXA SCAN

## 2015-04-05 LAB — HM MAMMOGRAPHY

## 2015-04-10 ENCOUNTER — Encounter: Payer: Self-pay | Admitting: General Practice

## 2015-04-10 MED ORDER — ALENDRONATE SODIUM 70 MG PO TABS: 70.0000 mg | ORAL_TABLET | ORAL | Status: DC

## 2015-04-11 ENCOUNTER — Telehealth: Payer: Self-pay | Admitting: Family Medicine

## 2015-04-11 MED ORDER — ALENDRONATE SODIUM 70 MG PO TABS: 70.0000 mg | ORAL_TABLET | ORAL | Status: DC

## 2015-04-11 NOTE — Telephone Encounter (Signed)
°  Relation to EH:UDJS Call back number:[phone] Pharmacy:[facility]  Reason for call: pt states that her pharmacy never received the rx alendronate (FOSAMAX) 70 MG tablet , please resend

## 2015-04-11 NOTE — Telephone Encounter (Signed)
Med resent again today.

## 2015-04-18 ENCOUNTER — Telehealth: Payer: Self-pay | Admitting: General Practice

## 2015-04-18 NOTE — Telephone Encounter (Signed)
Pt is restarting the fosamax therapy. This medication was sent to the pharmacy on 04/11/15.

## 2015-05-08 ENCOUNTER — Ambulatory Visit (INDEPENDENT_AMBULATORY_CARE_PROVIDER_SITE_OTHER): Payer: Commercial Managed Care - HMO | Admitting: Family Medicine

## 2015-05-08 ENCOUNTER — Encounter: Payer: Self-pay | Admitting: Family Medicine

## 2015-05-08 VITALS — BP 120/80 | HR 72 | Temp 98.0°F | Resp 16 | Ht 59.0 in | Wt 118.5 lb

## 2015-05-08 DIAGNOSIS — I10 Essential (primary) hypertension: Secondary | ICD-10-CM | POA: Diagnosis not present

## 2015-05-08 DIAGNOSIS — M48061 Spinal stenosis, lumbar region without neurogenic claudication: Secondary | ICD-10-CM

## 2015-05-08 DIAGNOSIS — E785 Hyperlipidemia, unspecified: Secondary | ICD-10-CM

## 2015-05-08 DIAGNOSIS — M4806 Spinal stenosis, lumbar region: Secondary | ICD-10-CM

## 2015-05-08 DIAGNOSIS — Z23 Encounter for immunization: Secondary | ICD-10-CM

## 2015-05-08 LAB — LIPID PANEL
CHOL/HDL RATIO: 3
Cholesterol: 187 mg/dL (ref 0–200)
HDL: 65.1 mg/dL (ref >39.00)
LDL Cholesterol: 107 mg/dL — ABNORMAL HIGH (ref 0–99)
NonHDL: 121.47
TRIGLYCERIDES: 70 mg/dL (ref 0.0–149.0)
VLDL: 14 mg/dL (ref 0.0–40.0)

## 2015-05-08 LAB — BASIC METABOLIC PANEL
BUN: 20 mg/dL (ref 6–23)
CHLORIDE: 101 meq/L (ref 96–112)
CO2: 30 meq/L (ref 19–32)
CREATININE: 0.66 mg/dL (ref 0.40–1.20)
Calcium: 9.8 mg/dL (ref 8.4–10.5)
GFR: 91.56 mL/min (ref >60.00)
Glucose, Bld: 114 mg/dL — ABNORMAL HIGH (ref 70–99)
POTASSIUM: 4.7 meq/L (ref 3.5–5.1)
Sodium: 138 mEq/L (ref 135–145)

## 2015-05-08 LAB — HEPATIC FUNCTION PANEL
ALT: 16 U/L (ref 0–35)
AST: 19 U/L (ref 0–37)
Albumin: 4.2 g/dL (ref 3.5–5.2)
Alkaline Phosphatase: 63 U/L (ref 39–117)
BILIRUBIN TOTAL: 0.7 mg/dL (ref 0.2–1.2)
Bilirubin, Direct: 0.1 mg/dL (ref 0.0–0.3)
TOTAL PROTEIN: 7.3 g/dL (ref 6.0–8.3)

## 2015-05-08 LAB — CBC WITH DIFFERENTIAL/PLATELET
BASOS ABS: 0 10*3/uL (ref 0.0–0.1)
BASOS PCT: 0.4 % (ref 0.0–3.0)
EOS ABS: 0.1 10*3/uL (ref 0.0–0.7)
Eosinophils Relative: 1.4 % (ref 0.0–5.0)
HCT: 45.1 % (ref 36.0–46.0)
Hemoglobin: 14.9 g/dL (ref 12.0–15.0)
LYMPHS PCT: 20.5 % (ref 12.0–46.0)
Lymphs Abs: 1.7 10*3/uL (ref 0.7–4.0)
MCHC: 33 g/dL (ref 30.0–36.0)
MCV: 99.6 fl (ref 78.0–100.0)
MONO ABS: 0.8 10*3/uL (ref 0.1–1.0)
Monocytes Relative: 9.9 % (ref 3.0–12.0)
Neutro Abs: 5.5 10*3/uL (ref 1.4–7.7)
Neutrophils Relative %: 67.8 % (ref 43.0–77.0)
Platelets: 236 10*3/uL (ref 150.0–400.0)
RBC: 4.53 Mil/uL (ref 3.87–5.11)
RDW: 14.4 % (ref 11.5–15.5)
WBC: 8.2 10*3/uL (ref 4.0–10.5)

## 2015-05-08 NOTE — Assessment & Plan Note (Signed)
Chronic problem.  Well controlled.  Asymptomatic.  Check labs.  No anticipated med changes. 

## 2015-05-08 NOTE — Assessment & Plan Note (Signed)
Chronic problem.  Controlling w/ healthy diet and fish oil daily.  Check labs.  Adjust tx plan prn.

## 2015-05-08 NOTE — Progress Notes (Signed)
   Subjective:    Patient ID: Shannon Austin, female    DOB: [birth year], 81 y.o.   MRN: [id number]  HPI HTN- chronic problem, on losartan, nadolol, spironolactone.  Well controlled.  No CP, SOB, HAs, visual changes, edema.  Hyperlipidemia- chronic problem, on fish oil daily w/ good results.  No abd pain, N/V.  Spinal stenosis- pt has hx of this but it is now limiting her ability to walk any distance due to pain.  Pt is asking for handicapped placard to improve her ease of mobility.  Pt continues to get PT/massage therapy.   Review of Systems For ROS see HPI     Objective:   Physical Exam  Constitutional: She is oriented to person, place, and time. She appears well-developed and well-nourished. No distress.  HENT:  Head: Normocephalic and atraumatic.  Eyes: Conjunctivae and EOM are normal. Pupils are equal, round, and reactive to light.  Neck: Normal range of motion. Neck supple. No thyromegaly present.  Cardiovascular: Normal rate, regular rhythm, normal heart sounds and intact distal pulses.   No murmur heard. Pulmonary/Chest: Effort normal and breath sounds normal. No respiratory distress.  Abdominal: Soft. She exhibits no distension. There is no tenderness.  Musculoskeletal: She exhibits no edema.  Lymphadenopathy:    She has no cervical adenopathy.  Neurological: She is alert and oriented to person, place, and time.  Skin: Skin is warm and dry.  Psychiatric: She has a normal mood and affect. Her behavior is normal.  Vitals reviewed.         Assessment & Plan:

## 2015-05-08 NOTE — Assessment & Plan Note (Signed)
New to provider, ongoing for pt.  Confirmed on MRI.  Is now causing her difficulty w/ walking and she is asking for a handicapped placard.  Form completed ang given to pt.

## 2015-05-08 NOTE — Progress Notes (Signed)
Pre visit review using our clinic review tool, if applicable. No additional management support is needed unless otherwise documented below in the visit note. 

## 2015-05-08 NOTE — Patient Instructions (Signed)
Schedule your complete physical in 6 months We'll notify you of your lab results and make any changes if needed Keep up the good work on healthy diet and regular activity You look great! Call with any questions or concerns Have a great fall season!!

## 2015-05-09 ENCOUNTER — Encounter: Payer: Self-pay | Admitting: General Practice

## 2015-06-13 ENCOUNTER — Other Ambulatory Visit: Payer: Self-pay | Admitting: Family Medicine

## 2015-06-13 NOTE — Telephone Encounter (Signed)
Medication filled to pharmacy as requested.   

## 2015-10-05 ENCOUNTER — Other Ambulatory Visit: Payer: Self-pay | Admitting: Family Medicine

## 2015-10-07 NOTE — Telephone Encounter (Signed)
Medication filled to pharmacy as requested.  Pt has a CPE with new provider next month.

## 2015-10-31 DIAGNOSIS — N359 Urethral stricture, unspecified: Secondary | ICD-10-CM | POA: Diagnosis not present

## 2015-11-15 DIAGNOSIS — L853 Xerosis cutis: Secondary | ICD-10-CM | POA: Diagnosis not present

## 2015-11-15 DIAGNOSIS — Z08 Encounter for follow-up examination after completed treatment for malignant neoplasm: Secondary | ICD-10-CM | POA: Diagnosis not present

## 2015-11-15 DIAGNOSIS — L814 Other melanin hyperpigmentation: Secondary | ICD-10-CM | POA: Diagnosis not present

## 2015-11-15 DIAGNOSIS — Z85828 Personal history of other malignant neoplasm of skin: Secondary | ICD-10-CM | POA: Diagnosis not present

## 2015-11-19 ENCOUNTER — Encounter: Payer: Commercial Managed Care - HMO | Admitting: Family Medicine

## 2015-12-11 ENCOUNTER — Encounter: Payer: Self-pay | Admitting: Family Medicine

## 2015-12-11 ENCOUNTER — Telehealth: Payer: Self-pay | Admitting: Family Medicine

## 2015-12-11 NOTE — Telephone Encounter (Signed)
Attempted to contact patient to reschedule appointment with Dr. Carollee Herter scheduled for March 02, 2016. Appointment cancelled and letter mailed 4/12

## 2015-12-20 ENCOUNTER — Telehealth: Payer: Self-pay | Admitting: Family Medicine

## 2015-12-20 MED ORDER — LOSARTAN POTASSIUM 100 MG PO TABS: 100.0000 mg | ORAL_TABLET | Freq: Every day | ORAL | Status: DC

## 2015-12-20 NOTE — Telephone Encounter (Signed)
Rx faxed.    KP 

## 2015-12-20 NOTE — Telephone Encounter (Signed)
Caller name:Self  Can be reached: [phone]  Pharmacy:  [id number] [place], [place] (Phone) [phone] (Fax)         Reason for call: Refill losartan (COZAAR) 100 MG tablet [id number]

## 2015-12-27 ENCOUNTER — Other Ambulatory Visit: Payer: Self-pay | Admitting: Family Medicine

## 2015-12-27 NOTE — Telephone Encounter (Signed)
Medication filled to pharmacy as requested.   

## 2016-01-09 DIAGNOSIS — Z961 Presence of intraocular lens: Secondary | ICD-10-CM | POA: Diagnosis not present

## 2016-01-24 DIAGNOSIS — L959 Vasculitis limited to the skin, unspecified: Secondary | ICD-10-CM | POA: Diagnosis not present

## 2016-01-24 DIAGNOSIS — L309 Dermatitis, unspecified: Secondary | ICD-10-CM | POA: Diagnosis not present

## 2016-01-24 DIAGNOSIS — D492 Neoplasm of unspecified behavior of bone, soft tissue, and skin: Secondary | ICD-10-CM | POA: Diagnosis not present

## 2016-02-03 DIAGNOSIS — L958 Other vasculitis limited to the skin: Secondary | ICD-10-CM | POA: Diagnosis not present

## 2016-02-03 DIAGNOSIS — L959 Vasculitis limited to the skin, unspecified: Secondary | ICD-10-CM | POA: Diagnosis not present

## 2016-02-11 DIAGNOSIS — N200 Calculus of kidney: Secondary | ICD-10-CM | POA: Diagnosis not present

## 2016-02-11 DIAGNOSIS — N3946 Mixed incontinence: Secondary | ICD-10-CM | POA: Diagnosis not present

## 2016-02-11 DIAGNOSIS — N359 Urethral stricture, unspecified: Secondary | ICD-10-CM | POA: Diagnosis not present

## 2016-03-02 ENCOUNTER — Encounter: Payer: Commercial Managed Care - HMO | Admitting: Family Medicine

## 2016-03-23 ENCOUNTER — Other Ambulatory Visit: Payer: Self-pay | Admitting: Family Medicine

## 2016-03-23 NOTE — Telephone Encounter (Signed)
Medication filled to pharmacy as requested.   

## 2016-03-31 ENCOUNTER — Encounter: Payer: Self-pay | Admitting: Family Medicine

## 2016-03-31 ENCOUNTER — Ambulatory Visit (INDEPENDENT_AMBULATORY_CARE_PROVIDER_SITE_OTHER): Payer: PPO | Admitting: Family Medicine

## 2016-03-31 VITALS — BP 108/60 | HR 72 | Temp 98.3°F | Ht <= 58 in | Wt 114.0 lb

## 2016-03-31 DIAGNOSIS — Z0000 Encounter for general adult medical examination without abnormal findings: Secondary | ICD-10-CM | POA: Diagnosis not present

## 2016-03-31 DIAGNOSIS — I10 Essential (primary) hypertension: Secondary | ICD-10-CM | POA: Diagnosis not present

## 2016-03-31 DIAGNOSIS — M810 Age-related osteoporosis without current pathological fracture: Secondary | ICD-10-CM | POA: Diagnosis not present

## 2016-03-31 MED ORDER — ALENDRONATE SODIUM 70 MG PO TABS: ORAL_TABLET | ORAL | 3 refills | Status: DC

## 2016-03-31 MED ORDER — LOSARTAN POTASSIUM 100 MG PO TABS: 100.0000 mg | ORAL_TABLET | Freq: Every day | ORAL | 3 refills | Status: DC

## 2016-03-31 MED ORDER — NADOLOL 20 MG PO TABS: 20.0000 mg | ORAL_TABLET | Freq: Every day | ORAL | 3 refills | Status: DC

## 2016-03-31 MED ORDER — SPIRONOLACTONE 25 MG PO TABS: 25.0000 mg | ORAL_TABLET | Freq: Two times a day (BID) | ORAL | 3 refills | Status: DC

## 2016-03-31 NOTE — Progress Notes (Signed)
Subjective:   Shannon Austin is a 82 y.o. female who presents for Medicare Annual (Subsequent) preventive examination.  Review of Systems:   Review of Systems  Constitutional: Negative for activity change, appetite change and fatigue.  HENT: Negative for hearing loss, congestion, tinnitus and ear discharge.   Eyes: Negative for visual disturbance (see optho q1y -- vision corrected to 20/20 with glasses).  Respiratory: Negative for cough, chest tightness and shortness of breath.   Cardiovascular: Negative for chest pain, palpitations and leg swelling.  Gastrointestinal: Negative for abdominal pain, diarrhea, constipation and abdominal distention.  Genitourinary: Negative for urgency, frequency, decreased urine volume and difficulty urinating.  Musculoskeletal: Negative for back pain, arthralgias and gait problem.  Skin: Negative for color change, pallor and rash.  Neurological: Negative for dizziness, light-headedness, numbness and headaches.  Hematological: Negative for adenopathy. Does not bruise/bleed easily.  Psychiatric/Behavioral: Negative for suicidal ideas, confusion, sleep disturbance, self-injury, dysphoric mood, decreased concentration and agitation.  Pt is able to read and write and can do all ADLs No risk for falling No abuse/ violence in home          Objective:     Vitals: BP 108/60 (BP Location: Left Arm, Patient Position: Sitting, Cuff Size: Small)   Pulse 72   Temp 98.3 F (36.8 C) (Oral)   Ht 4' 9.5" (1.461 m)   Wt 114 lb (51.7 kg)   SpO2 98%   BMI 24.24 kg/m   Body mass index is 24.24 kg/m. BP 108/60 (BP Location: Left Arm, Patient Position: Sitting, Cuff Size: Small)   Pulse 72   Temp 98.3 F (36.8 C) (Oral)   Ht 4' 9.5" (1.461 m)   Wt 114 lb (51.7 kg)   SpO2 98%   BMI 24.24 kg/m  General appearance: alert, cooperative, appears stated age and no distress Head: Normocephalic, without obvious abnormality, atraumatic Eyes: conjunctivae/corneas  clear. PERRL, EOM's intact. Fundi benign. Ears: normal TM's and external ear canals both ears Nose: Nares normal. Septum midline. Mucosa normal. No drainage or sinus tenderness. Throat: lips, mucosa, and tongue normal; teeth and gums normal Neck: no adenopathy, no carotid bruit, no JVD, supple, symmetrical, trachea midline and thyroid not enlarged, symmetric, no tenderness/mass/nodules Back: symmetric, no curvature. ROM normal. No CVA tenderness. Lungs: clear to auscultation bilaterally Breasts: normal appearance, no masses or tenderness Heart: regular rate and rhythm, S1, S2 normal, no murmur, click, rub or gallop Abdomen: soft, non-tender; bowel sounds normal; no masses,  no organomegaly Pelvic: not indicated; post-menopausal, no abnormal Pap smears in past Extremities: extremities normal, atraumatic, no cyanosis or edema Pulses: 2+ and symmetric Skin: Skin color, texture, turgor normal. No rashes or lesions Lymph nodes: Cervical, supraclavicular, and axillary nodes normal. Neurologic: Alert and oriented X 3, normal strength and tone. Normal symmetric reflexes. Normal coordination and gait  Tobacco History  Smoking Status  . Former Smoker  Smokeless Tobacco  . Not on file     Counseling given: Not Answered   Past Medical History:  Diagnosis Date  . Gross hematuria   . Hyperlipidemia   . Hypertension   . Osteoporosis   . Supraventricular tachycardia ([place])   . Vaginitis    atropic   Past Surgical History:  Procedure Laterality Date  . APPENDECTOMY    . CARPAL TUNNEL RELEASE     right  . CATARACT EXTRACTION     left eye  . CESAREAN SECTION    . CYSTOSCOPY  05/09/10, 09/11/10, 11/21/12, 01/01/15  . DILATION AND CURETTAGE  OF UTERUS    . fibrocystic mass     right   Family History  Problem Relation Age of Onset  . Hypertension Mother   . Hypertension Father   . Diabetes Maternal Grandmother    History  Sexual Activity  . Sexual activity: Not on file    Outpatient  Encounter Prescriptions as of 03/31/2016  Medication Sig  . alendronate (FOSAMAX) 70 MG tablet TAKE 1 TABLET(70 MG) BY MOUTH EVERY 7 DAYS WITH A FULL GLASS OF WATER AND ON AN EMPTY STOMACH  . Calcium Carbonate (CALCIUM 600 PO) Take 600 mg by mouth 2 (two) times daily.   Marland Kitchen CRANBERRY EXTRACT PO Take by mouth.  Adora Fridge VAGINAL 0.1 MG/GM vaginal cream Place 1 Applicatorful vaginally 3 (three) times a week.   . fish oil-omega-3 fatty acids 1000 MG capsule Take 2 g by mouth 2 (two) times daily.    Marland Kitchen losartan (COZAAR) 100 MG tablet Take 1 tablet (100 mg total) by mouth daily.  . Multiple Vitamins-Minerals (ICAPS MV PO) Take 1 tablet by mouth 2 (two) times daily.  . nadolol (CORGARD) 20 MG tablet Take 1 tablet (20 mg total) by mouth daily.  Marland Kitchen spironolactone (ALDACTONE) 25 MG tablet Take 1 tablet (25 mg total) by mouth 2 (two) times daily.  . [DISCONTINUED] alendronate (FOSAMAX) 70 MG tablet TAKE 1 TABLET(70 MG) BY MOUTH EVERY 7 DAYS WITH A FULL GLASS OF WATER AND ON AN EMPTY STOMACH  . [DISCONTINUED] losartan (COZAAR) 100 MG tablet Take 1 tablet (100 mg total) by mouth daily.  . [DISCONTINUED] Multiple Vitamins-Minerals (OCUVITE PO) Take by mouth 2 (two) times daily.    . [DISCONTINUED] nadolol (CORGARD) 20 MG tablet TAKE 1 TABLET EVERY DAY  . [DISCONTINUED] spironolactone (ALDACTONE) 25 MG tablet TAKE 1 TABLET TWICE DAILY   No facility-administered encounter medications on file as of 03/31/2016.     Activities of Daily Living In your present state of health, do you have any difficulty performing the following activities: 03/31/2016  Hearing? N  Vision? N  Difficulty concentrating or making decisions? N  Walking or climbing stairs? N  Dressing or bathing? N  Doing errands, shopping? N  Some recent data might be hidden    Patient Care Team: Ann Held, DO as PCP - General (Family Medicine) Milana Na, MD (General Surgery) [place]. Toya Smothers., MD as Consulting Physician  (Otolaryngology)    Assessment:    CPE Exercise Activities and Dietary recommendations Current Exercise Habits: Home exercise routine, Type of exercise: strength training/weights, Time (Minutes): 60, Frequency (Times/Week): 1, Weekly Exercise (Minutes/Week): 60, Intensity: Mild, Exercise limited by: orthopedic condition(s)  Goals    None     Fall Risk Fall Risk  03/31/2016 10/31/2014 05/03/2013  Falls in the past year? No No No   Depression Screen PHQ 2/9 Scores 03/31/2016 10/31/2014 05/03/2013 01/07/2012  PHQ - 2 Score 0 0 0 0     Cognitive Testing mmse 30/30  Immunization History  Administered Date(s) Administered  . Influenza Split 05/10/2013  . Influenza Whole 06/14/2009, 06/23/2010  . Influenza,inj,Quad PF,36+ Mos 06/01/2014, 05/08/2015  . Pneumococcal Conjugate-13 10/31/2014  . Pneumococcal Polysaccharide-23 04/02/2010  . Zoster 08/31/2012   Screening Tests Health Maintenance  Topic Date Due  . TETANUS/TDAP  03/21/1952  . INFLUENZA VACCINE  05/31/2016 (Originally 03/31/2016)  . MAMMOGRAM  04/04/2016  . DEXA SCAN  04/04/2017  . ZOSTAVAX  Completed  . PNA vac Low Risk Adult  Completed  Plan:   see AVS During the course of the visit the patient was educated and counseled about the following appropriate screening and preventive services:   Vaccines to include Pneumoccal, Influenza, Hepatitis B, Td, Zostavax, HCV  Electrocardiogram  Cardiovascular Disease  Colorectal cancer screening  Bone density screening  Diabetes screening  Glaucoma screening  Mammography/PAP  Nutrition counseling   Patient Instructions (the written plan) was given to the patient.  1. Preventative health care See above  2. Essential hypertension stable - spironolactone (ALDACTONE) 25 MG tablet; Take 1 tablet (25 mg total) by mouth 2 (two) times daily.  Dispense: 180 tablet; Refill: 3 - nadolol (CORGARD) 20 MG tablet; Take 1 tablet (20 mg total) by mouth daily.  Dispense: 90 tablet;  Refill: 3 - losartan (COZAAR) 100 MG tablet; Take 1 tablet (100 mg total) by mouth daily.  Dispense: 90 tablet; Refill: 3 - Comprehensive metabolic panel; Future - CBC with Differential/Platelet; Future - Lipid panel; Future - POCT urinalysis dipstick; Future  3. Osteoporosis  - alendronate (FOSAMAX) 70 MG tablet; TAKE 1 TABLET(70 MG) BY MOUTH EVERY 7 DAYS WITH A FULL GLASS OF WATER AND ON AN EMPTY STOMACH  Dispense: 12 tablet; Refill: 3  4. Routine history and physical examination of adult   Ann Held, DO  03/31/2016

## 2016-03-31 NOTE — Patient Instructions (Signed)
Preventive Care for Adults, Female A healthy lifestyle and preventive care can promote health and wellness. Preventive health guidelines for women include the following key practices.  A routine yearly physical is a good way to check with your health care provider about your health and preventive screening. It is a chance to share any concerns and updates on your health and to receive a thorough exam.  Visit your dentist for a routine exam and preventive care every 6 months. Brush your teeth twice a day and floss once a day. Good oral hygiene prevents tooth decay and gum disease.  The frequency of eye exams is based on your age, health, family medical history, use of contact lenses, and other factors. Follow your health care provider's recommendations for frequency of eye exams.  Eat a healthy diet. Foods like vegetables, fruits, whole grains, low-fat dairy products, and lean protein foods contain the nutrients you need without too many calories. Decrease your intake of foods high in solid fats, added sugars, and salt. Eat the right amount of calories for you.Get information about a proper diet from your health care provider, if necessary.  Regular physical exercise is one of the most important things you can do for your health. Most adults should get at least 150 minutes of moderate-intensity exercise (any activity that increases your heart rate and causes you to sweat) each week. In addition, most adults need muscle-strengthening exercises on 2 or more days a week.  Maintain a healthy weight. The body mass index (BMI) is a screening tool to identify possible weight problems. It provides an estimate of body fat based on height and weight. Your health care provider can find your BMI and can help you achieve or maintain a healthy weight.For adults 20 years and older:  A BMI below 18.5 is considered underweight.  A BMI of 18.5 to 24.9 is normal.  A BMI of 25 to 29.9 is considered overweight.  A  BMI of 30 and above is considered obese.  Maintain normal blood lipids and cholesterol levels by exercising and minimizing your intake of saturated fat. Eat a balanced diet with plenty of fruit and vegetables. Blood tests for lipids and cholesterol should begin at age 45 and be repeated every 5 years. If your lipid or cholesterol levels are high, you are over 50, or you are at high risk for heart disease, you may need your cholesterol levels checked more frequently.Ongoing high lipid and cholesterol levels should be treated with medicines if diet and exercise are not working.  If you smoke, find out from your health care provider how to quit. If you do not use tobacco, do not start.  Lung cancer screening is recommended for adults aged 45-80 years who are at high risk for developing lung cancer because of a history of smoking. A yearly low-dose CT scan of the lungs is recommended for people who have at least a 30-pack-year history of smoking and are a current smoker or have quit within the past 15 years. A pack year of smoking is smoking an average of 1 pack of cigarettes a day for 1 year (for example: 1 pack a day for 30 years or 2 packs a day for 15 years). Yearly screening should continue until the smoker has stopped smoking for at least 15 years. Yearly screening should be stopped for people who develop a health problem that would prevent them from having lung cancer treatment.  If you are pregnant, do not drink alcohol. If you are  breastfeeding, be very cautious about drinking alcohol. If you are not pregnant and choose to drink alcohol, do not have more than 1 drink per day. One drink is considered to be 12 ounces (355 mL) of beer, 5 ounces (148 mL) of wine, or 1.5 ounces (44 mL) of liquor.  Avoid use of street drugs. Do not share needles with anyone. Ask for help if you need support or instructions about stopping the use of drugs.  High blood pressure causes heart disease and increases the risk  of stroke. Your blood pressure should be checked at least every 1 to 2 years. Ongoing high blood pressure should be treated with medicines if weight loss and exercise do not work.  If you are 55-79 years old, ask your health care provider if you should take aspirin to prevent strokes.  Diabetes screening is done by taking a blood sample to check your blood glucose level after you have not eaten for a certain period of time (fasting). If you are not overweight and you do not have risk factors for diabetes, you should be screened once every 3 years starting at age 45. If you are overweight or obese and you are 40-70 years of age, you should be screened for diabetes every year as part of your cardiovascular risk assessment.  Breast cancer screening is essential preventive care for women. You should practice "breast self-awareness." This means understanding the normal appearance and feel of your breasts and may include breast self-examination. Any changes detected, no matter how small, should be reported to a health care provider. Women in their 20s and 30s should have a clinical breast exam (CBE) by a health care provider as part of a regular health exam every 1 to 3 years. After age 40, women should have a CBE every year. Starting at age 40, women should consider having a mammogram (breast X-ray test) every year. Women who have a family history of breast cancer should talk to their health care provider about genetic screening. Women at a high risk of breast cancer should talk to their health care providers about having an MRI and a mammogram every year.  Breast cancer gene (BRCA)-related cancer risk assessment is recommended for women who have family members with BRCA-related cancers. BRCA-related cancers include breast, ovarian, tubal, and peritoneal cancers. Having family members with these cancers may be associated with an increased risk for harmful changes (mutations) in the breast cancer genes BRCA1 and  BRCA2. Results of the assessment will determine the need for genetic counseling and BRCA1 and BRCA2 testing.  Your health care provider may recommend that you be screened regularly for cancer of the pelvic organs (ovaries, uterus, and vagina). This screening involves a pelvic examination, including checking for microscopic changes to the surface of your cervix (Pap test). You may be encouraged to have this screening done every 3 years, beginning at age 21.  For women ages 30-65, health care providers may recommend pelvic exams and Pap testing every 3 years, or they may recommend the Pap and pelvic exam, combined with testing for human papilloma virus (HPV), every 5 years. Some types of HPV increase your risk of cervical cancer. Testing for HPV may also be done on women of any age with unclear Pap test results.  Other health care providers may not recommend any screening for nonpregnant women who are considered low risk for pelvic cancer and who do not have symptoms. Ask your health care provider if a screening pelvic exam is right for   you.  If you have had past treatment for cervical cancer or a condition that could lead to cancer, you need Pap tests and screening for cancer for at least 20 years after your treatment. If Pap tests have been discontinued, your risk factors (such as having a new sexual partner) need to be reassessed to determine if screening should resume. Some women have medical problems that increase the chance of getting cervical cancer. In these cases, your health care provider may recommend more frequent screening and Pap tests.  Colorectal cancer can be detected and often prevented. Most routine colorectal cancer screening begins at the age of 50 years and continues through age 75 years. However, your health care provider may recommend screening at an earlier age if you have risk factors for colon cancer. On a yearly basis, your health care provider may provide home test kits to check  for hidden blood in the stool. Use of a small camera at the end of a tube, to directly examine the colon (sigmoidoscopy or colonoscopy), can detect the earliest forms of colorectal cancer. Talk to your health care provider about this at age 50, when routine screening begins. Direct exam of the colon should be repeated every 5-10 years through age 75 years, unless early forms of precancerous polyps or small growths are found.  People who are at an increased risk for hepatitis B should be screened for this virus. You are considered at high risk for hepatitis B if:  You were born in a country where hepatitis B occurs often. Talk with your health care provider about which countries are considered high risk.  Your parents were born in a high-risk country and you have not received a shot to protect against hepatitis B (hepatitis B vaccine).  You have HIV or AIDS.  You use needles to inject street drugs.  You live with, or have sex with, someone who has hepatitis B.  You get hemodialysis treatment.  You take certain medicines for conditions like cancer, organ transplantation, and autoimmune conditions.  Hepatitis C blood testing is recommended for all people born from 1945 through 1965 and any individual with known risks for hepatitis C.  Practice safe sex. Use condoms and avoid high-risk sexual practices to reduce the spread of sexually transmitted infections (STIs). STIs include gonorrhea, chlamydia, syphilis, trichomonas, herpes, HPV, and human immunodeficiency virus (HIV). Herpes, HIV, and HPV are viral illnesses that have no cure. They can result in disability, cancer, and death.  You should be screened for sexually transmitted illnesses (STIs) including gonorrhea and chlamydia if:  You are sexually active and are younger than 24 years.  You are older than 24 years and your health care provider tells you that you are at risk for this type of infection.  Your sexual activity has changed  since you were last screened and you are at an increased risk for chlamydia or gonorrhea. Ask your health care provider if you are at risk.  If you are at risk of being infected with HIV, it is recommended that you take a prescription medicine daily to prevent HIV infection. This is called preexposure prophylaxis (PrEP). You are considered at risk if:  You are sexually active and do not regularly use condoms or know the HIV status of your partner(s).  You take drugs by injection.  You are sexually active with a partner who has HIV.  Talk with your health care provider about whether you are at high risk of being infected with HIV. If   you choose to begin PrEP, you should first be tested for HIV. You should then be tested every 3 months for as long as you are taking PrEP.  Osteoporosis is a disease in which the bones lose minerals and strength with aging. This can result in serious bone fractures or breaks. The risk of osteoporosis can be identified using a bone density scan. Women ages 67 years and over and women at risk for fractures or osteoporosis should discuss screening with their health care providers. Ask your health care provider whether you should take a calcium supplement or vitamin D to reduce the rate of osteoporosis.  Menopause can be associated with physical symptoms and risks. Hormone replacement therapy is available to decrease symptoms and risks. You should talk to your health care provider about whether hormone replacement therapy is right for you.  Use sunscreen. Apply sunscreen liberally and repeatedly throughout the day. You should seek shade when your shadow is shorter than you. Protect yourself by wearing long sleeves, pants, a wide-brimmed hat, and sunglasses year round, whenever you are outdoors.  Once a month, do a whole body skin exam, using a mirror to look at the skin on your back. Tell your health care provider of new moles, moles that have irregular borders, moles that  are larger than a pencil eraser, or moles that have changed in shape or color.  Stay current with required vaccines (immunizations).  Influenza vaccine. All adults should be immunized every year.  Tetanus, diphtheria, and acellular pertussis (Td, Tdap) vaccine. Pregnant women should receive 1 dose of Tdap vaccine during each pregnancy. The dose should be obtained regardless of the length of time since the last dose. Immunization is preferred during the 27th-36th week of gestation. An adult who has not previously received Tdap or who does not know her vaccine status should receive 1 dose of Tdap. This initial dose should be followed by tetanus and diphtheria toxoids (Td) booster doses every 10 years. Adults with an unknown or incomplete history of completing a 3-dose immunization series with Td-containing vaccines should begin or complete a primary immunization series including a Tdap dose. Adults should receive a Td booster every 10 years.  Varicella vaccine. An adult without evidence of immunity to varicella should receive 2 doses or a second dose if she has previously received 1 dose. Pregnant females who do not have evidence of immunity should receive the first dose after pregnancy. This first dose should be obtained before leaving the health care facility. The second dose should be obtained 4-8 weeks after the first dose.  Human papillomavirus (HPV) vaccine. Females aged 13-26 years who have not received the vaccine previously should obtain the 3-dose series. The vaccine is not recommended for use in pregnant females. However, pregnancy testing is not needed before receiving a dose. If a female is found to be pregnant after receiving a dose, no treatment is needed. In that case, the remaining doses should be delayed until after the pregnancy. Immunization is recommended for any person with an immunocompromised condition through the age of 61 years if she did not get any or all doses earlier. During the  3-dose series, the second dose should be obtained 4-8 weeks after the first dose. The third dose should be obtained 24 weeks after the first dose and 16 weeks after the second dose.  Zoster vaccine. One dose is recommended for adults aged 30 years or older unless certain conditions are present.  Measles, mumps, and rubella (MMR) vaccine. Adults born  before 1957 generally are considered immune to measles and mumps. Adults born in 1957 or later should have 1 or more doses of MMR vaccine unless there is a contraindication to the vaccine or there is laboratory evidence of immunity to each of the three diseases. A routine second dose of MMR vaccine should be obtained at least 28 days after the first dose for students attending postsecondary schools, health care workers, or international travelers. People who received inactivated measles vaccine or an unknown type of measles vaccine during 1963-1967 should receive 2 doses of MMR vaccine. People who received inactivated mumps vaccine or an unknown type of mumps vaccine before 1979 and are at high risk for mumps infection should consider immunization with 2 doses of MMR vaccine. For females of childbearing age, rubella immunity should be determined. If there is no evidence of immunity, females who are not pregnant should be vaccinated. If there is no evidence of immunity, females who are pregnant should delay immunization until after pregnancy. Unvaccinated health care workers born before 1957 who lack laboratory evidence of measles, mumps, or rubella immunity or laboratory confirmation of disease should consider measles and mumps immunization with 2 doses of MMR vaccine or rubella immunization with 1 dose of MMR vaccine.  Pneumococcal 13-valent conjugate (PCV13) vaccine. When indicated, a person who is uncertain of his immunization history and has no record of immunization should receive the PCV13 vaccine. All adults 65 years of age and older should receive this  vaccine. An adult aged 19 years or older who has certain medical conditions and has not been previously immunized should receive 1 dose of PCV13 vaccine. This PCV13 should be followed with a dose of pneumococcal polysaccharide (PPSV23) vaccine. Adults who are at high risk for pneumococcal disease should obtain the PPSV23 vaccine at least 8 weeks after the dose of PCV13 vaccine. Adults older than 80 years of age who have normal immune system function should obtain the PPSV23 vaccine dose at least 1 year after the dose of PCV13 vaccine.  Pneumococcal polysaccharide (PPSV23) vaccine. When PCV13 is also indicated, PCV13 should be obtained first. All adults aged 65 years and older should be immunized. An adult younger than age 65 years who has certain medical conditions should be immunized. Any person who resides in a nursing home or long-term care facility should be immunized. An adult smoker should be immunized. People with an immunocompromised condition and certain other conditions should receive both PCV13 and PPSV23 vaccines. People with human immunodeficiency virus (HIV) infection should be immunized as soon as possible after diagnosis. Immunization during chemotherapy or radiation therapy should be avoided. Routine use of PPSV23 vaccine is not recommended for American Indians, [place] Natives, or people younger than 65 years unless there are medical conditions that require PPSV23 vaccine. When indicated, people who have unknown immunization and have no record of immunization should receive PPSV23 vaccine. One-time revaccination 5 years after the first dose of PPSV23 is recommended for people aged 19-64 years who have chronic kidney failure, nephrotic syndrome, asplenia, or immunocompromised conditions. People who received 1-2 doses of PPSV23 before age 65 years should receive another dose of PPSV23 vaccine at age 65 years or later if at least 5 years have passed since the previous dose. Doses of PPSV23 are not  needed for people immunized with PPSV23 at or after age 65 years.  Meningococcal vaccine. Adults with asplenia or persistent complement component deficiencies should receive 2 doses of quadrivalent meningococcal conjugate (MenACWY-D) vaccine. The doses should be obtained   at least 2 months apart. Microbiologists working with certain meningococcal bacteria, [place] recruits, people at risk during an outbreak, and people who travel to or live in countries with a high rate of meningitis should be immunized. A first-year college student up through age 34 years who is living in a residence hall should receive a dose if she did not receive a dose on or after her 16th birthday. Adults who have certain high-risk conditions should receive one or more doses of vaccine.  Hepatitis A vaccine. Adults who wish to be protected from this disease, have certain high-risk conditions, work with hepatitis A-infected animals, work in hepatitis A research labs, or travel to or work in countries with a high rate of hepatitis A should be immunized. Adults who were previously unvaccinated and who anticipate close contact with an international adoptee during the first 60 days after arrival in the [place] from a country with a high rate of hepatitis A should be immunized.  Hepatitis B vaccine. Adults who wish to be protected from this disease, have certain high-risk conditions, may be exposed to blood or other infectious body fluids, are household contacts or sex partners of hepatitis B positive people, are clients or workers in certain care facilities, or travel to or work in countries with a high rate of hepatitis B should be immunized.  Haemophilus influenzae type b (Hib) vaccine. A previously unvaccinated person with asplenia or sickle cell disease or having a scheduled splenectomy should receive 1 dose of Hib vaccine. Regardless of previous immunization, a recipient of a hematopoietic stem cell transplant should receive a  3-dose series 6-12 months after her successful transplant. Hib vaccine is not recommended for adults with HIV infection. Preventive Services / Frequency Ages 35 to 4 years  Blood pressure check.** / Every 3-5 years.  Lipid and cholesterol check.** / Every 5 years beginning at age 60.  Clinical breast exam.** / Every 3 years for women in their 71s and 10s.  BRCA-related cancer risk assessment.** / For women who have family members with a BRCA-related cancer (breast, ovarian, tubal, or peritoneal cancers).  Pap test.** / Every 2 years from ages 76 through 26. Every 3 years starting at age 61 through age 76 or 93 with a history of 3 consecutive normal Pap tests.  HPV screening.** / Every 3 years from ages 37 through ages 60 to 51 with a history of 3 consecutive normal Pap tests.  Hepatitis C blood test.** / For any individual with known risks for hepatitis C.  Skin self-exam. / Monthly.  Influenza vaccine. / Every year.  Tetanus, diphtheria, and acellular pertussis (Tdap, Td) vaccine.** / Consult your health care provider. Pregnant women should receive 1 dose of Tdap vaccine during each pregnancy. 1 dose of Td every 10 years.  Varicella vaccine.** / Consult your health care provider. Pregnant females who do not have evidence of immunity should receive the first dose after pregnancy.  HPV vaccine. / 3 doses over 6 months, if 93 and younger. The vaccine is not recommended for use in pregnant females. However, pregnancy testing is not needed before receiving a dose.  Measles, mumps, rubella (MMR) vaccine.** / You need at least 1 dose of MMR if you were born in 1957 or later. You may also need a 2nd dose. For females of childbearing age, rubella immunity should be determined. If there is no evidence of immunity, females who are not pregnant should be vaccinated. If there is no evidence of immunity, females who are  pregnant should delay immunization until after pregnancy.  Pneumococcal  13-valent conjugate (PCV13) vaccine.** / Consult your health care provider.  Pneumococcal polysaccharide (PPSV23) vaccine.** / 1 to 2 doses if you smoke cigarettes or if you have certain conditions.  Meningococcal vaccine.** / 1 dose if you are age 68 to 8 years and a Market researcher living in a residence hall, or have one of several medical conditions, you need to get vaccinated against meningococcal disease. You may also need additional booster doses.  Hepatitis A vaccine.** / Consult your health care provider.  Hepatitis B vaccine.** / Consult your health care provider.  Haemophilus influenzae type b (Hib) vaccine.** / Consult your health care provider. Ages 7 to 53 years  Blood pressure check.** / Every year.  Lipid and cholesterol check.** / Every 5 years beginning at age 25 years.  Lung cancer screening. / Every year if you are aged 11-80 years and have a 30-pack-year history of smoking and currently smoke or have quit within the past 15 years. Yearly screening is stopped once you have quit smoking for at least 15 years or develop a health problem that would prevent you from having lung cancer treatment.  Clinical breast exam.** / Every year after age 48 years.  BRCA-related cancer risk assessment.** / For women who have family members with a BRCA-related cancer (breast, ovarian, tubal, or peritoneal cancers).  Mammogram.** / Every year beginning at age 41 years and continuing for as long as you are in good health. Consult with your health care provider.  Pap test.** / Every 3 years starting at age 65 years through age 37 or 70 years with a history of 3 consecutive normal Pap tests.  HPV screening.** / Every 3 years from ages 72 years through ages 60 to 40 years with a history of 3 consecutive normal Pap tests.  Fecal occult blood test (FOBT) of stool. / Every year beginning at age 21 years and continuing until age 5 years. You may not need to do this test if you get  a colonoscopy every 10 years.  Flexible sigmoidoscopy or colonoscopy.** / Every 5 years for a flexible sigmoidoscopy or every 10 years for a colonoscopy beginning at age 35 years and continuing until age 48 years.  Hepatitis C blood test.** / For all people born from [birth year] through 1965 and any individual with known risks for hepatitis C.  Skin self-exam. / Monthly.  Influenza vaccine. / Every year.  Tetanus, diphtheria, and acellular pertussis (Tdap/Td) vaccine.** / Consult your health care provider. Pregnant women should receive 1 dose of Tdap vaccine during each pregnancy. 1 dose of Td every 10 years.  Varicella vaccine.** / Consult your health care provider. Pregnant females who do not have evidence of immunity should receive the first dose after pregnancy.  Zoster vaccine.** / 1 dose for adults aged 30 years or older.  Measles, mumps, rubella (MMR) vaccine.** / You need at least 1 dose of MMR if you were born in 1957 or later. You may also need a second dose. For females of childbearing age, rubella immunity should be determined. If there is no evidence of immunity, females who are not pregnant should be vaccinated. If there is no evidence of immunity, females who are pregnant should delay immunization until after pregnancy.  Pneumococcal 13-valent conjugate (PCV13) vaccine.** / Consult your health care provider.  Pneumococcal polysaccharide (PPSV23) vaccine.** / 1 to 2 doses if you smoke cigarettes or if you have certain conditions.  Meningococcal vaccine.** /  Consult your health care provider.  Hepatitis A vaccine.** / Consult your health care provider.  Hepatitis B vaccine.** / Consult your health care provider.  Haemophilus influenzae type b (Hib) vaccine.** / Consult your health care provider. Ages 64 years and over  Blood pressure check.** / Every year.  Lipid and cholesterol check.** / Every 5 years beginning at age 23 years.  Lung cancer screening. / Every year if you  are aged 16-80 years and have a 30-pack-year history of smoking and currently smoke or have quit within the past 15 years. Yearly screening is stopped once you have quit smoking for at least 15 years or develop a health problem that would prevent you from having lung cancer treatment.  Clinical breast exam.** / Every year after age 74 years.  BRCA-related cancer risk assessment.** / For women who have family members with a BRCA-related cancer (breast, ovarian, tubal, or peritoneal cancers).  Mammogram.** / Every year beginning at age 44 years and continuing for as long as you are in good health. Consult with your health care provider.  Pap test.** / Every 3 years starting at age 58 years through age 22 or 39 years with 3 consecutive normal Pap tests. Testing can be stopped between 65 and 70 years with 3 consecutive normal Pap tests and no abnormal Pap or HPV tests in the past 10 years.  HPV screening.** / Every 3 years from ages 64 years through ages 70 or 61 years with a history of 3 consecutive normal Pap tests. Testing can be stopped between 65 and 70 years with 3 consecutive normal Pap tests and no abnormal Pap or HPV tests in the past 10 years.  Fecal occult blood test (FOBT) of stool. / Every year beginning at age 40 years and continuing until age 27 years. You may not need to do this test if you get a colonoscopy every 10 years.  Flexible sigmoidoscopy or colonoscopy.** / Every 5 years for a flexible sigmoidoscopy or every 10 years for a colonoscopy beginning at age 7 years and continuing until age 32 years.  Hepatitis C blood test.** / For all people born from [birth year] through 1965 and any individual with known risks for hepatitis C.  Osteoporosis screening.** / A one-time screening for women ages 30 years and over and women at risk for fractures or osteoporosis.  Skin self-exam. / Monthly.  Influenza vaccine. / Every year.  Tetanus, diphtheria, and acellular pertussis (Tdap/Td)  vaccine.** / 1 dose of Td every 10 years.  Varicella vaccine.** / Consult your health care provider.  Zoster vaccine.** / 1 dose for adults aged 35 years or older.  Pneumococcal 13-valent conjugate (PCV13) vaccine.** / Consult your health care provider.  Pneumococcal polysaccharide (PPSV23) vaccine.** / 1 dose for all adults aged 46 years and older.  Meningococcal vaccine.** / Consult your health care provider.  Hepatitis A vaccine.** / Consult your health care provider.  Hepatitis B vaccine.** / Consult your health care provider.  Haemophilus influenzae type b (Hib) vaccine.** / Consult your health care provider. ** Family history and personal history of risk and conditions may change your health care provider's recommendations.   This information is not intended to replace advice given to you by your health care provider. Make sure you discuss any questions you have with your health care provider.   Document Released: 10/13/2001 Document Revised: 09/07/2014 Document Reviewed: 01/12/2011 Elsevier Interactive Patient Education [employer].

## 2016-03-31 NOTE — Progress Notes (Signed)
Pre visit review using our clinic review tool, if applicable. No additional management support is needed unless otherwise documented below in the visit note. 

## 2016-04-02 ENCOUNTER — Other Ambulatory Visit (INDEPENDENT_AMBULATORY_CARE_PROVIDER_SITE_OTHER): Payer: PPO

## 2016-04-02 DIAGNOSIS — I10 Essential (primary) hypertension: Secondary | ICD-10-CM | POA: Diagnosis not present

## 2016-04-02 LAB — CBC WITH DIFFERENTIAL/PLATELET
BASOS ABS: 0 10*3/uL (ref 0.0–0.1)
BASOS PCT: 0.3 % (ref 0.0–3.0)
Eosinophils Absolute: 0.2 10*3/uL (ref 0.0–0.7)
Eosinophils Relative: 2.4 % (ref 0.0–5.0)
HEMATOCRIT: 42.9 % (ref 36.0–46.0)
Hemoglobin: 14.2 g/dL (ref 12.0–15.0)
LYMPHS PCT: 25.2 % (ref 12.0–46.0)
Lymphs Abs: 1.9 10*3/uL (ref 0.7–4.0)
MCHC: 33 g/dL (ref 30.0–36.0)
MCV: 98.1 fl (ref 78.0–100.0)
MONOS PCT: 11.1 % (ref 3.0–12.0)
Monocytes Absolute: 0.8 10*3/uL (ref 0.1–1.0)
NEUTROS ABS: 4.6 10*3/uL (ref 1.4–7.7)
Neutrophils Relative %: 61 % (ref 43.0–77.0)
PLATELETS: 249 10*3/uL (ref 150.0–400.0)
RBC: 4.38 Mil/uL (ref 3.87–5.11)
RDW: 14.9 % (ref 11.5–15.5)
WBC: 7.5 10*3/uL (ref 4.0–10.5)

## 2016-04-02 LAB — COMPREHENSIVE METABOLIC PANEL
ALT: 22 U/L (ref 0–35)
AST: 20 U/L (ref 0–37)
Albumin: 4.1 g/dL (ref 3.5–5.2)
Alkaline Phosphatase: 69 U/L (ref 39–117)
BUN: 23 mg/dL (ref 6–23)
CALCIUM: 9.5 mg/dL (ref 8.4–10.5)
CHLORIDE: 100 meq/L (ref 96–112)
CO2: 28 meq/L (ref 19–32)
Creatinine, Ser: 0.64 mg/dL (ref 0.40–1.20)
GFR: 94.65 mL/min (ref >60.00)
Glucose, Bld: 102 mg/dL — ABNORMAL HIGH (ref 70–99)
Potassium: 4.1 mEq/L (ref 3.5–5.1)
Sodium: 136 mEq/L (ref 135–145)
Total Bilirubin: 0.7 mg/dL (ref 0.2–1.2)
Total Protein: 7.2 g/dL (ref 6.0–8.3)

## 2016-04-02 LAB — LIPID PANEL
CHOL/HDL RATIO: 3
Cholesterol: 184 mg/dL (ref 0–200)
HDL: 66.7 mg/dL (ref >39.00)
LDL CALC: 106 mg/dL — AB (ref 0–99)
NONHDL: 117.18
Triglycerides: 58 mg/dL (ref 0.0–149.0)
VLDL: 11.6 mg/dL (ref 0.0–40.0)

## 2016-04-09 DIAGNOSIS — H6123 Impacted cerumen, bilateral: Secondary | ICD-10-CM | POA: Diagnosis not present

## 2016-04-16 DIAGNOSIS — N200 Calculus of kidney: Secondary | ICD-10-CM | POA: Diagnosis not present

## 2016-06-25 DIAGNOSIS — Z1231 Encounter for screening mammogram for malignant neoplasm of breast: Secondary | ICD-10-CM | POA: Diagnosis not present

## 2016-06-25 LAB — HM MAMMOGRAPHY

## 2016-07-21 ENCOUNTER — Encounter: Payer: Self-pay | Admitting: Family Medicine

## 2016-10-01 ENCOUNTER — Ambulatory Visit: Payer: PPO | Admitting: Family Medicine

## 2016-10-06 ENCOUNTER — Encounter: Payer: Self-pay | Admitting: Family Medicine

## 2016-10-06 ENCOUNTER — Ambulatory Visit (INDEPENDENT_AMBULATORY_CARE_PROVIDER_SITE_OTHER): Payer: PPO | Admitting: Family Medicine

## 2016-10-06 VITALS — BP 121/78 | HR 91 | Temp 97.8°F | Wt 119.4 lb

## 2016-10-06 DIAGNOSIS — I10 Essential (primary) hypertension: Secondary | ICD-10-CM

## 2016-10-06 DIAGNOSIS — E785 Hyperlipidemia, unspecified: Secondary | ICD-10-CM

## 2016-10-06 LAB — COMPREHENSIVE METABOLIC PANEL
ALBUMIN: 4.1 g/dL (ref 3.5–5.2)
ALT: 16 U/L (ref 0–35)
AST: 19 U/L (ref 0–37)
Alkaline Phosphatase: 64 U/L (ref 39–117)
BUN: 16 mg/dL (ref 6–23)
CHLORIDE: 104 meq/L (ref 96–112)
CO2: 29 mEq/L (ref 19–32)
CREATININE: 0.64 mg/dL (ref 0.40–1.20)
Calcium: 9.3 mg/dL (ref 8.4–10.5)
GFR: 94.53 mL/min (ref >60.00)
GLUCOSE: 112 mg/dL — AB (ref 70–99)
POTASSIUM: 4.1 meq/L (ref 3.5–5.1)
SODIUM: 137 meq/L (ref 135–145)
TOTAL PROTEIN: 7 g/dL (ref 6.0–8.3)
Total Bilirubin: 0.7 mg/dL (ref 0.2–1.2)

## 2016-10-06 LAB — LIPID PANEL
Cholesterol: 187 mg/dL (ref 0–200)
HDL: 70.5 mg/dL (ref >39.00)
LDL CALC: 104 mg/dL — AB (ref 0–99)
NONHDL: 116.59
Total CHOL/HDL Ratio: 3
Triglycerides: 65 mg/dL (ref 0.0–149.0)
VLDL: 13 mg/dL (ref 0.0–40.0)

## 2016-10-06 MED ORDER — ALENDRONATE SODIUM 70 MG PO TABS: ORAL_TABLET | ORAL | 3 refills | Status: DC

## 2016-10-06 NOTE — Progress Notes (Signed)
Pre visit review using our clinic review tool, if applicable. No additional management support is needed unless otherwise documented below in the visit note. 

## 2016-10-06 NOTE — Assessment & Plan Note (Signed)
Stable con't meds rto 6 mon for cpe

## 2016-10-06 NOTE — Progress Notes (Signed)
Patient ID: Shannon Austin, female   DOB: 02/08/1933, 83 y.o.   MRN: [id number]   Subjective:    Patient ID: Shannon Austin, female    DOB: 1933/07/02, 83 y.o.   MRN: [id number]  Chief Complaint  Patient presents with  . Follow-up    HPI  Patient is in today for a six month follow up. Following up hypertension, osteoporosis, hyperlipidemia.  No additional concerns noted.  I acted as a Education administrator for [employer], DO. Shannon Austin, Shannon Austin  Past Medical History:  Diagnosis Date  . Gross hematuria   . Hyperlipidemia   . Hypertension   . Osteoporosis   . Supraventricular tachycardia ([place])   . Vaginitis    atropic    Past Surgical History:  Procedure Laterality Date  . APPENDECTOMY    . CARPAL TUNNEL RELEASE     right  . CATARACT EXTRACTION     left eye  . CESAREAN SECTION    . CYSTOSCOPY  05/09/10, 09/11/10, 11/21/12, 01/01/15  . DILATION AND CURETTAGE OF UTERUS    . fibrocystic mass     right    Family History  Problem Relation Age of Onset  . Hypertension Mother   . Hypertension Father   . Diabetes Maternal Grandmother     Social History   Social History  . Marital status: Single    Spouse name: N/A  . Number of children: N/A  . Years of education: N/A   Occupational History  . nursing- retired    Social History Main Topics  . Smoking status: Former Research scientist (life sciences)  . Smokeless tobacco: Never Used  . Alcohol use Yes     Comment: rare  . Drug use: Unknown  . Sexual activity: Not on file   Other Topics Concern  . Not on file   Social History Narrative   Widower (2000)    Outpatient Medications Prior to Visit  Medication Sig Dispense Refill  . Calcium Carbonate (CALCIUM 600 PO) Take 600 mg by mouth 2 (two) times daily.     Marland Kitchen CRANBERRY EXTRACT PO Take by mouth.    Adora Fridge VAGINAL 0.1 MG/GM vaginal cream Place 1 Applicatorful vaginally 3 (three) times a week.     . fish oil-omega-3 fatty acids 1000 MG capsule Take 2 g by mouth 2 (two) times daily.      Marland Kitchen losartan  (COZAAR) 100 MG tablet Take 1 tablet (100 mg total) by mouth daily. 90 tablet 3  . Multiple Vitamins-Minerals (ICAPS MV PO) Take 1 tablet by mouth 2 (two) times daily.    . nadolol (CORGARD) 20 MG tablet Take 1 tablet (20 mg total) by mouth daily. 90 tablet 3  . spironolactone (ALDACTONE) 25 MG tablet Take 1 tablet (25 mg total) by mouth 2 (two) times daily. 180 tablet 3  . alendronate (FOSAMAX) 70 MG tablet TAKE 1 TABLET(70 MG) BY MOUTH EVERY 7 DAYS WITH A FULL GLASS OF WATER AND ON AN EMPTY STOMACH 12 tablet 3   No facility-administered medications prior to visit.     Allergies  Allergen Reactions  . Amlodipine Besylate   . Ezetimibe   . Hydrochlorothiazide   . Latex   . Lisinopril Cough  . Statins     Muscle weakness  . Sulfonamide Derivatives Rash and Other (See Comments)    HA    Review of Systems  Constitutional: Negative for fever and malaise/fatigue.  HENT: Negative for congestion.   Eyes: Negative for blurred vision.  Respiratory: Negative for  cough and shortness of breath.   Cardiovascular: Negative for chest pain, palpitations and leg swelling.  Gastrointestinal: Negative for vomiting.  Musculoskeletal: Negative for back pain.  Skin: Negative for rash.  Neurological: Negative for loss of consciousness and headaches.       Objective:    Physical Exam  Constitutional: She is oriented to person, place, and time. She appears well-developed and well-nourished. No distress.  HENT:  Head: Normocephalic and atraumatic.  Eyes: Conjunctivae are normal.  Neck: Normal range of motion. No thyromegaly present.  Cardiovascular: Normal rate and regular rhythm.   Pulmonary/Chest: Effort normal and breath sounds normal. She has no wheezes.  Abdominal: Soft. Bowel sounds are normal. There is no tenderness.  Musculoskeletal: She exhibits no edema or deformity.  Neurological: She is alert and oriented to person, place, and time.  Skin: Skin is warm and dry. She is not  diaphoretic.  Psychiatric: She has a normal mood and affect.    BP 121/78 (BP Location: Left Arm, Patient Position: Sitting, Cuff Size: Normal)   Pulse 91   Temp 97.8 F (36.6 C) (Oral)   Wt 119 lb 6.4 oz (54.2 kg)   SpO2 99% Comment: RA  BMI 25.39 kg/m  Wt Readings from Last 3 Encounters:  10/06/16 119 lb 6.4 oz (54.2 kg)  03/31/16 114 lb (51.7 kg)  05/08/15 118 lb 8 oz (53.8 kg)     Lab Results  Component Value Date   WBC 7.5 04/02/2016   HGB 14.2 04/02/2016   HCT 42.9 04/02/2016   PLT 249.0 04/02/2016   GLUCOSE 102 (H) 04/02/2016   CHOL 184 04/02/2016   TRIG 58.0 04/02/2016   HDL 66.70 04/02/2016   LDLDIRECT 125.8 05/03/2013   LDLCALC 106 (H) 04/02/2016   ALT 22 04/02/2016   AST 20 04/02/2016   NA 136 04/02/2016   K 4.1 04/02/2016   CL 100 04/02/2016   CREATININE 0.64 04/02/2016   BUN 23 04/02/2016   CO2 28 04/02/2016   TSH 1.57 10/31/2014   INR 1.0 07/19/2008   HGBA1C 5.7 04/02/2010    Lab Results  Component Value Date   TSH 1.57 10/31/2014   Lab Results  Component Value Date   WBC 7.5 04/02/2016   HGB 14.2 04/02/2016   HCT 42.9 04/02/2016   MCV 98.1 04/02/2016   PLT 249.0 04/02/2016   Lab Results  Component Value Date   NA 136 04/02/2016   K 4.1 04/02/2016   CO2 28 04/02/2016   GLUCOSE 102 (H) 04/02/2016   BUN 23 04/02/2016   CREATININE 0.64 04/02/2016   BILITOT 0.7 04/02/2016   ALKPHOS 69 04/02/2016   AST 20 04/02/2016   ALT 22 04/02/2016   PROT 7.2 04/02/2016   ALBUMIN 4.1 04/02/2016   CALCIUM 9.5 04/02/2016   GFR 94.65 04/02/2016   Lab Results  Component Value Date   CHOL 184 04/02/2016   Lab Results  Component Value Date   HDL 66.70 04/02/2016   Lab Results  Component Value Date   LDLCALC 106 (H) 04/02/2016   Lab Results  Component Value Date   TRIG 58.0 04/02/2016   Lab Results  Component Value Date   CHOLHDL 3 04/02/2016   Lab Results  Component Value Date   HGBA1C 5.7 04/02/2010       Assessment & Plan:    Problem List Items Addressed This Visit      Unprioritized   Hyperlipidemia - Primary   Relevant Orders   Lipid panel  Other Visit Diagnoses    Hypertension, unspecified type       Relevant Orders   Comprehensive metabolic panel      I am having Shannon Austin maintain her Calcium Carbonate (CALCIUM 600 PO), fish oil-omega-3 fatty acids, ESTRACE VAGINAL, CRANBERRY EXTRACT PO, Multiple Vitamins-Minerals (ICAPS MV PO), spironolactone, nadolol, losartan, and alendronate.  Meds ordered this encounter  Medications  . alendronate (FOSAMAX) 70 MG tablet    Sig: TAKE 1 TABLET(70 MG) BY MOUTH EVERY 7 DAYS WITH A FULL GLASS OF WATER AND ON AN EMPTY STOMACH    Dispense:  12 tablet    Refill:  3    CMA served as scribe during this visit. History, Physical and Plan performed by medical provider. Documentation and orders reviewed and attested to.  Ann Held, DO

## 2016-10-06 NOTE — Patient Instructions (Signed)
Hypertension Hypertension, commonly called high blood pressure, is when the force of blood pumping through your arteries is too strong. Your arteries are the blood vessels that carry blood from your heart throughout your body. A blood pressure reading consists of a higher number over a lower number, such as 110/72. The higher number (systolic) is the pressure inside your arteries when your heart pumps. The lower number (diastolic) is the pressure inside your arteries when your heart relaxes. Ideally you want your blood pressure below 120/80. Hypertension forces your heart to work harder to pump blood. Your arteries may become narrow or stiff. Having untreated or uncontrolled hypertension can cause heart attack, stroke, kidney disease, and other problems. What increases the risk? Some risk factors for high blood pressure are controllable. Others are not. Risk factors you cannot control include:  Race. You may be at higher risk if you are African American.  Age. Risk increases with age.  Gender. Men are at higher risk than women before age 45 years. After age 65, women are at higher risk than men. Risk factors you can control include:  Not getting enough exercise or physical activity.  Being overweight.  Getting too much fat, sugar, calories, or salt in your diet.  Drinking too much alcohol. What are the signs or symptoms? Hypertension does not usually cause signs or symptoms. Extremely high blood pressure (hypertensive crisis) may cause headache, anxiety, shortness of breath, and nosebleed. How is this diagnosed? To check if you have hypertension, your health care provider will measure your blood pressure while you are seated, with your arm held at the level of your heart. It should be measured at least twice using the same arm. Certain conditions can cause a difference in blood pressure between your right and left arms. A blood pressure reading that is higher than normal on one occasion does  not mean that you need treatment. If it is not clear whether you have high blood pressure, you may be asked to return on a different day to have your blood pressure checked again. Or, you may be asked to monitor your blood pressure at home for 1 or more weeks. How is this treated? Treating high blood pressure includes making lifestyle changes and possibly taking medicine. Living a healthy lifestyle can help lower high blood pressure. You may need to change some of your habits. Lifestyle changes may include:  Following the DASH diet. This diet is high in fruits, vegetables, and whole grains. It is low in salt, red meat, and added sugars.  Keep your sodium intake below 2,300 mg per day.  Getting at least 30-45 minutes of aerobic exercise at least 4 times per week.  Losing weight if necessary.  Not smoking.  Limiting alcoholic beverages.  Learning ways to reduce stress. Your health care provider may prescribe medicine if lifestyle changes are not enough to get your blood pressure under control, and if one of the following is true:  You are 18-59 years of age and your systolic blood pressure is above 140.  You are 60 years of age or older, and your systolic blood pressure is above 150.  Your diastolic blood pressure is above 90.  You have diabetes, and your systolic blood pressure is over 140 or your diastolic blood pressure is over 90.  You have kidney disease and your blood pressure is above 140/90.  You have heart disease and your blood pressure is above 140/90. Your personal target blood pressure may vary depending on your medical   conditions, your age, and other factors. Follow these instructions at home:  Have your blood pressure rechecked as directed by your health care provider.  Take medicines only as directed by your health care provider. Follow the directions carefully. Blood pressure medicines must be taken as prescribed. The medicine does not work as well when you skip  doses. Skipping doses also puts you at risk for problems.  Do not smoke.  Monitor your blood pressure at home as directed by your health care provider. Contact a health care provider if:  You think you are having a reaction to medicines taken.  You have recurrent headaches or feel dizzy.  You have swelling in your ankles.  You have trouble with your vision. Get help right away if:  You develop a severe headache or confusion.  You have unusual weakness, numbness, or feel faint.  You have severe chest or abdominal pain.  You vomit repeatedly.  You have trouble breathing. This information is not intended to replace advice given to you by your health care provider. Make sure you discuss any questions you have with your health care provider. Document Released: 08/17/2005 Document Revised: 01/23/2016 Document Reviewed: 06/09/2013 Elsevier Interactive Patient Education  2017 Elsevier Inc[employer]  

## 2016-10-12 DIAGNOSIS — H6123 Impacted cerumen, bilateral: Secondary | ICD-10-CM | POA: Diagnosis not present

## 2016-10-12 DIAGNOSIS — H60542 Acute eczematoid otitis externa, left ear: Secondary | ICD-10-CM | POA: Diagnosis not present

## 2016-10-27 DIAGNOSIS — N200 Calculus of kidney: Secondary | ICD-10-CM | POA: Diagnosis not present

## 2016-11-16 DIAGNOSIS — L814 Other melanin hyperpigmentation: Secondary | ICD-10-CM | POA: Diagnosis not present

## 2016-11-16 DIAGNOSIS — D1801 Hemangioma of skin and subcutaneous tissue: Secondary | ICD-10-CM | POA: Diagnosis not present

## 2016-11-16 DIAGNOSIS — Z08 Encounter for follow-up examination after completed treatment for malignant neoplasm: Secondary | ICD-10-CM | POA: Diagnosis not present

## 2016-11-16 DIAGNOSIS — Z85828 Personal history of other malignant neoplasm of skin: Secondary | ICD-10-CM | POA: Diagnosis not present

## 2017-01-28 ENCOUNTER — Telehealth: Payer: Self-pay | Admitting: Family Medicine

## 2017-01-28 NOTE — Telephone Encounter (Signed)
Called patient to schedule awv. Pt did not answer. Will try to call patient at a later time.

## 2017-04-12 DIAGNOSIS — H6123 Impacted cerumen, bilateral: Secondary | ICD-10-CM | POA: Diagnosis not present

## 2017-04-27 DIAGNOSIS — N200 Calculus of kidney: Secondary | ICD-10-CM | POA: Diagnosis not present

## 2017-05-01 ENCOUNTER — Other Ambulatory Visit: Payer: Self-pay | Admitting: Family Medicine

## 2017-05-01 DIAGNOSIS — I10 Essential (primary) hypertension: Secondary | ICD-10-CM

## 2017-05-04 ENCOUNTER — Ambulatory Visit (INDEPENDENT_AMBULATORY_CARE_PROVIDER_SITE_OTHER): Payer: PPO | Admitting: Family Medicine

## 2017-05-04 ENCOUNTER — Encounter: Payer: Self-pay | Admitting: Family Medicine

## 2017-05-04 VITALS — BP 112/74 | HR 88 | Ht <= 58 in | Wt 121.6 lb

## 2017-05-04 DIAGNOSIS — I10 Essential (primary) hypertension: Secondary | ICD-10-CM

## 2017-05-04 DIAGNOSIS — M810 Age-related osteoporosis without current pathological fracture: Secondary | ICD-10-CM

## 2017-05-04 DIAGNOSIS — Z0000 Encounter for general adult medical examination without abnormal findings: Secondary | ICD-10-CM | POA: Diagnosis not present

## 2017-05-04 DIAGNOSIS — Z23 Encounter for immunization: Secondary | ICD-10-CM | POA: Diagnosis not present

## 2017-05-04 LAB — CBC WITH DIFFERENTIAL/PLATELET
BASOS ABS: 0 10*3/uL (ref 0.0–0.1)
Basophils Relative: 0.6 % (ref 0.0–3.0)
EOS PCT: 2.5 % (ref 0.0–5.0)
Eosinophils Absolute: 0.2 10*3/uL (ref 0.0–0.7)
HCT: 45.8 % (ref 36.0–46.0)
HEMOGLOBIN: 15.1 g/dL — AB (ref 12.0–15.0)
LYMPHS ABS: 1.4 10*3/uL (ref 0.7–4.0)
Lymphocytes Relative: 21.4 % (ref 12.0–46.0)
MCHC: 32.9 g/dL (ref 30.0–36.0)
MCV: 100.8 fl — ABNORMAL HIGH (ref 78.0–100.0)
MONO ABS: 0.7 10*3/uL (ref 0.1–1.0)
MONOS PCT: 10.5 % (ref 3.0–12.0)
NEUTROS PCT: 65 % (ref 43.0–77.0)
Neutro Abs: 4.1 10*3/uL (ref 1.4–7.7)
Platelets: 236 10*3/uL (ref 150.0–400.0)
RBC: 4.55 Mil/uL (ref 3.87–5.11)
RDW: 13.7 % (ref 11.5–15.5)
WBC: 6.4 10*3/uL (ref 4.0–10.5)

## 2017-05-04 LAB — LIPID PANEL
CHOLESTEROL: 195 mg/dL (ref 0–200)
HDL: 67.5 mg/dL (ref >39.00)
LDL CALC: 115 mg/dL — AB (ref 0–99)
NonHDL: 127.01
TRIGLYCERIDES: 58 mg/dL (ref 0.0–149.0)
Total CHOL/HDL Ratio: 3
VLDL: 11.6 mg/dL (ref 0.0–40.0)

## 2017-05-04 LAB — COMPREHENSIVE METABOLIC PANEL
ALBUMIN: 4.3 g/dL (ref 3.5–5.2)
ALT: 15 U/L (ref 0–35)
AST: 20 U/L (ref 0–37)
Alkaline Phosphatase: 79 U/L (ref 39–117)
BUN: 21 mg/dL (ref 6–23)
CALCIUM: 10.1 mg/dL (ref 8.4–10.5)
CHLORIDE: 101 meq/L (ref 96–112)
CO2: 27 mEq/L (ref 19–32)
CREATININE: 0.66 mg/dL (ref 0.40–1.20)
GFR: 91.1 mL/min (ref >60.00)
Glucose, Bld: 112 mg/dL — ABNORMAL HIGH (ref 70–99)
POTASSIUM: 4.8 meq/L (ref 3.5–5.1)
Sodium: 137 mEq/L (ref 135–145)
TOTAL PROTEIN: 7.2 g/dL (ref 6.0–8.3)
Total Bilirubin: 0.7 mg/dL (ref 0.2–1.2)

## 2017-05-04 MED ORDER — NADOLOL 20 MG PO TABS: 20.0000 mg | ORAL_TABLET | Freq: Every day | ORAL | 3 refills | Status: DC

## 2017-05-04 MED ORDER — ALENDRONATE SODIUM 70 MG PO TABS: ORAL_TABLET | ORAL | 3 refills | Status: DC

## 2017-05-04 MED ORDER — LOSARTAN POTASSIUM 100 MG PO TABS: 100.0000 mg | ORAL_TABLET | Freq: Every day | ORAL | 3 refills | Status: DC

## 2017-05-04 MED ORDER — SPIRONOLACTONE 25 MG PO TABS: 25.0000 mg | ORAL_TABLET | Freq: Two times a day (BID) | ORAL | 3 refills | Status: DC

## 2017-05-04 NOTE — Progress Notes (Signed)
Subjective:     Shannon Austin is a 83 y.o. female and is here for a comprehensive physical exam. The patient reports no problems.  Social History   Social History  . Marital status: Single    Spouse name: N/A  . Number of children: N/A  . Years of education: N/A   Occupational History  . nursing- retired    Social History Main Topics  . Smoking status: Former Research scientist (life sciences)  . Smokeless tobacco: Never Used  . Alcohol use Yes     Comment: rare  . Drug use: Unknown  . Sexual activity: Not on file   Other Topics Concern  . Not on file   Social History Narrative   Widower (2000)   Health Maintenance  Topic Date Due  . Samul Dada  03/[birth year]/1954  . INFLUENZA VACCINE  03/31/2017  . DEXA SCAN  04/04/2017  . MAMMOGRAM  06/25/2017  . PNA vac Low Risk Adult  Completed    The following portions of the patient's history were reviewed and updated as appropriate:  She  has a past medical history of Gross hematuria; Hyperlipidemia; Hypertension; Osteoporosis; Supraventricular tachycardia ([place]); and Vaginitis. She  does not have any pertinent problems on file. She  has a past surgical history that includes Cesarean section; Dilation and curettage of uterus; fibrocystic mass; Appendectomy; Cataract extraction; Carpal tunnel release; and Cystoscopy (05/09/10, 09/11/10, 11/21/12, 01/01/15). Her family history includes Diabetes in her maternal grandmother; Hypertension in her father and mother. She  reports that she has quit smoking. She has never used smokeless tobacco. She reports that she drinks alcohol. Her drug history is not on file. She has a current medication list which includes the following prescription(s): alendronate, calcium carbonate, cranberry, estrace vaginal, fish oil-omega-3 fatty acids, losartan, multiple vitamins-minerals, nadolol, and spironolactone. Current Outpatient Prescriptions on File Prior to Visit  Medication Sig Dispense Refill  . alendronate (FOSAMAX) 70 MG tablet TAKE 1  TABLET(70 MG) BY MOUTH EVERY 7 DAYS WITH A FULL GLASS OF WATER AND ON AN EMPTY STOMACH 12 tablet 3  . Calcium Carbonate (CALCIUM 600 PO) Take 600 mg by mouth 2 (two) times daily.     Marland Kitchen CRANBERRY EXTRACT PO Take by mouth.    Adora Fridge VAGINAL 0.1 MG/GM vaginal cream Place 1 Applicatorful vaginally 3 (three) times a week.     . fish oil-omega-3 fatty acids 1000 MG capsule Take 2 g by mouth 2 (two) times daily.      Marland Kitchen losartan (COZAAR) 100 MG tablet Take 1 tablet (100 mg total) by mouth daily. 90 tablet 3  . Multiple Vitamins-Minerals (ICAPS MV PO) Take 1 tablet by mouth 2 (two) times daily.    . nadolol (CORGARD) 20 MG tablet Take 1 tablet (20 mg total) by mouth daily. 90 tablet 3  . spironolactone (ALDACTONE) 25 MG tablet Take 1 tablet (25 mg total) by mouth 2 (two) times daily. 180 tablet 3   No current facility-administered medications on file prior to visit.    She is allergic to amlodipine besylate; ezetimibe; hydrochlorothiazide; latex; lisinopril; statins; and sulfonamide derivatives..  Review of Systems Review of Systems  Constitutional: Negative for activity change, appetite change and fatigue.  HENT: Negative for hearing loss, congestion, tinnitus and ear discharge.  dentist q54m Eyes: Negative for visual disturbance (see optho q1y -- vision corrected to 20/20 with glasses).  Respiratory: Negative for cough, chest tightness and shortness of breath.   Cardiovascular: Negative for chest pain, palpitations and leg swelling.  Gastrointestinal: Negative  for abdominal pain, diarrhea, constipation and abdominal distention.  Genitourinary: Negative for urgency, frequency, decreased urine volume and difficulty urinating.  Musculoskeletal: Negative for back pain, arthralgias and gait problem.  Skin: Negative for color change, pallor and rash.  Neurological: Negative for dizziness, light-headedness, numbness and headaches.  Hematological: Negative for adenopathy. Does not bruise/bleed easily.   Psychiatric/Behavioral: Negative for suicidal ideas, confusion, sleep disturbance, self-injury, dysphoric mood, decreased concentration and agitation.       Objective:    BP 112/74 (BP Location: Right Arm, Patient Position: Sitting)   Pulse 88   Ht 4' 9.5" (1.461 m)   Wt 121 lb 9.6 oz (55.2 kg)   SpO2 98%   BMI 25.86 kg/m  General appearance: alert, cooperative, appears stated age and no distress Head: Normocephalic, without obvious abnormality, atraumatic Eyes: negative findings: lids and lashes normal, conjunctivae and sclerae normal and pupils equal, round, reactive to light and accomodation Ears: normal TM's and external ear canals both ears Nose: Nares normal. Septum midline. Mucosa normal. No drainage or sinus tenderness. Throat: lips, mucosa, and tongue normal; teeth and gums normal Neck: no adenopathy, no carotid bruit, no JVD, supple, symmetrical, trachea midline and thyroid not enlarged, symmetric, no tenderness/mass/nodules Back: symmetric, no curvature. ROM normal. No CVA tenderness. Lungs: clear to auscultation bilaterally Breasts: normal appearance, no masses or tenderness Heart: regular rate and rhythm, S1, S2 normal, no murmur, click, rub or gallop Abdomen: soft, non-tender; bowel sounds normal; no masses,  no organomegaly Pelvic: not indicated; post-menopausal, no abnormal Pap smears in past Extremities: extremities normal, atraumatic, no cyanosis or edema Pulses: 2+ and symmetric Skin: Skin color, texture, turgor normal. No rashes or lesions Lymph nodes: Cervical, supraclavicular, and axillary nodes normal. Neurologic: Alert and oriented X 3, normal strength and tone. Normal symmetric reflexes. Normal coordination and gait    Assessment:    Healthy female exam.     Plan:    ghm utd Check labs See After Visit Summary for Counseling Recommendations    1. Essential hypertension Well controlled, no changes to meds. Encouraged heart healthy diet such as the  DASH diet and exercise as tolerated.  - spironolactone (ALDACTONE) 25 MG tablet; Take 1 tablet (25 mg total) by mouth 2 (two) times daily.  Dispense: 180 tablet; Refill: 3 - nadolol (CORGARD) 20 MG tablet; Take 1 tablet (20 mg total) by mouth daily.  Dispense: 90 tablet; Refill: 3 - losartan (COZAAR) 100 MG tablet; Take 1 tablet (100 mg total) by mouth daily.  Dispense: 90 tablet; Refill: 3 - CBC with Differential/Platelet - Lipid panel - Comprehensive metabolic panel  2. Age-related osteoporosis without current pathological fracture   - alendronate (FOSAMAX) 70 MG tablet; TAKE 1 TABLET(70 MG) BY MOUTH EVERY 7 DAYS WITH A FULL GLASS OF WATER AND ON AN EMPTY STOMACH  Dispense: 12 tablet; Refill: 3 - DG Bone Density; Future  3. Encounter for immunization   - Flu vaccine HIGH DOSE PF

## 2017-05-04 NOTE — Patient Instructions (Signed)
Preventive Care 81 Years and Older, Female Preventive care refers to lifestyle choices and visits with your health care provider that can promote health and wellness. What does preventive care include?  A yearly physical exam. This is also called an annual well check.  Dental exams once or twice a year.  Routine eye exams. Ask your health care provider how often you should have your eyes checked.  Personal lifestyle choices, including: ? Daily care of your teeth and gums. ? Regular physical activity. ? Eating a healthy diet. ? Avoiding tobacco and drug use. ? Limiting alcohol use. ? Practicing safe sex. ? Taking low-dose aspirin every day. ? Taking vitamin and mineral supplements as recommended by your health care provider. What happens during an annual well check? The services and screenings done by your health care provider during your annual well check will depend on your age, overall health, lifestyle risk factors, and family history of disease. Counseling Your health care provider may ask you questions about your:  Alcohol use.  Tobacco use.  Drug use.  Emotional well-being.  Home and relationship well-being.  Sexual activity.  Eating habits.  History of falls.  Memory and ability to understand (cognition).  Work and work environment.  Reproductive health.  Screening You may have the following tests or measurements:  Height, weight, and BMI.  Blood pressure.  Lipid and cholesterol levels. These may be checked every 5 years, or more frequently if you are over 50 years old.  Skin check.  Lung cancer screening. You may have this screening every year starting at age 55 if you have a 30-pack-year history of smoking and currently smoke or have quit within the past 15 years.  Fecal occult blood test (FOBT) of the stool. You may have this test every year starting at age 50.  Flexible sigmoidoscopy or colonoscopy. You may have a sigmoidoscopy every 5 years or  a colonoscopy every 10 years starting at age 50.  Hepatitis C blood test.  Hepatitis B blood test.  Sexually transmitted disease (STD) testing.  Diabetes screening. This is done by checking your blood sugar (glucose) after you have not eaten for a while (fasting). You may have this done every 1-3 years.  Bone density scan. This is done to screen for osteoporosis. You may have this done starting at age 65.  Mammogram. This may be done every 1-2 years. Talk to your health care provider about how often you should have regular mammograms.  Talk with your health care provider about your test results, treatment options, and if necessary, the need for more tests. Vaccines Your health care provider may recommend certain vaccines, such as:  Influenza vaccine. This is recommended every year.  Tetanus, diphtheria, and acellular pertussis (Tdap, Td) vaccine. You may need a Td booster every 10 years.  Varicella vaccine. You may need this if you have not been vaccinated.  Zoster vaccine. You may need this after age 60.  Measles, mumps, and rubella (MMR) vaccine. You may need at least one dose of MMR if you were born in 1957 or later. You may also need a second dose.  Pneumococcal 13-valent conjugate (PCV13) vaccine. One dose is recommended after age 65.  Pneumococcal polysaccharide (PPSV23) vaccine. One dose is recommended after age 65.  Meningococcal vaccine. You may need this if you have certain conditions.  Hepatitis A vaccine. You may need this if you have certain conditions or if you travel or work in places where you may be exposed to hepatitis   A.  Hepatitis B vaccine. You may need this if you have certain conditions or if you travel or work in places where you may be exposed to hepatitis B.  Haemophilus influenzae type b (Hib) vaccine. You may need this if you have certain conditions.  Talk to your health care provider about which screenings and vaccines you need and how often you  need them. This information is not intended to replace advice given to you by your health care provider. Make sure you discuss any questions you have with your health care provider. Document Released: 09/13/2015 Document Revised: 05/06/2016 Document Reviewed: 06/18/2015 Elsevier Interactive Patient Education  2017 [employer].

## 2017-05-05 ENCOUNTER — Telehealth: Payer: Self-pay | Admitting: Family Medicine

## 2017-05-05 NOTE — Telephone Encounter (Signed)
Spoke with pt regarding AWV. Pt stated that she is not interested in scheduling an appt at this time.

## 2017-05-24 ENCOUNTER — Other Ambulatory Visit: Payer: Self-pay | Admitting: Family Medicine

## 2017-05-24 DIAGNOSIS — I10 Essential (primary) hypertension: Secondary | ICD-10-CM

## 2017-05-24 NOTE — Telephone Encounter (Signed)
On 9.4.18 #90+3 was erx'ed/thx dmf

## 2017-07-15 DIAGNOSIS — L821 Other seborrheic keratosis: Secondary | ICD-10-CM | POA: Diagnosis not present

## 2017-10-13 DIAGNOSIS — J310 Chronic rhinitis: Secondary | ICD-10-CM | POA: Diagnosis not present

## 2017-10-13 DIAGNOSIS — H6123 Impacted cerumen, bilateral: Secondary | ICD-10-CM | POA: Diagnosis not present

## 2017-12-02 ENCOUNTER — Ambulatory Visit: Payer: PPO | Admitting: Family Medicine

## 2017-12-09 DIAGNOSIS — N3946 Mixed incontinence: Secondary | ICD-10-CM | POA: Diagnosis not present

## 2017-12-09 DIAGNOSIS — R358 Other polyuria: Secondary | ICD-10-CM | POA: Diagnosis not present

## 2017-12-30 DIAGNOSIS — N200 Calculus of kidney: Secondary | ICD-10-CM | POA: Diagnosis not present

## 2017-12-30 DIAGNOSIS — N3010 Interstitial cystitis (chronic) without hematuria: Secondary | ICD-10-CM | POA: Diagnosis not present

## 2018-01-06 ENCOUNTER — Encounter: Payer: Self-pay | Admitting: Family Medicine

## 2018-01-06 ENCOUNTER — Ambulatory Visit (INDEPENDENT_AMBULATORY_CARE_PROVIDER_SITE_OTHER): Payer: PPO | Admitting: Family Medicine

## 2018-01-06 VITALS — BP 128/88 | HR 78 | Temp 98.0°F | Resp 16 | Ht <= 58 in | Wt 121.4 lb

## 2018-01-06 DIAGNOSIS — I10 Essential (primary) hypertension: Secondary | ICD-10-CM

## 2018-01-06 DIAGNOSIS — R739 Hyperglycemia, unspecified: Secondary | ICD-10-CM

## 2018-01-06 DIAGNOSIS — E785 Hyperlipidemia, unspecified: Secondary | ICD-10-CM

## 2018-01-06 LAB — LIPID PANEL
Cholesterol: 189 mg/dL (ref 0–200)
HDL: 70.6 mg/dL (ref >39.00)
LDL Cholesterol: 107 mg/dL — ABNORMAL HIGH (ref 0–99)
NONHDL: 118.8
Total CHOL/HDL Ratio: 3
Triglycerides: 61 mg/dL (ref 0.0–149.0)
VLDL: 12.2 mg/dL (ref 0.0–40.0)

## 2018-01-06 LAB — COMPREHENSIVE METABOLIC PANEL
ALK PHOS: 69 U/L (ref 39–117)
ALT: 14 U/L (ref 0–35)
AST: 18 U/L (ref 0–37)
Albumin: 4.2 g/dL (ref 3.5–5.2)
BUN: 17 mg/dL (ref 6–23)
CO2: 30 meq/L (ref 19–32)
Calcium: 9.9 mg/dL (ref 8.4–10.5)
Chloride: 100 mEq/L (ref 96–112)
Creatinine, Ser: 0.66 mg/dL (ref 0.40–1.20)
GFR: 90.95 mL/min (ref >60.00)
GLUCOSE: 118 mg/dL — AB (ref 70–99)
Potassium: 4.5 mEq/L (ref 3.5–5.1)
Sodium: 135 mEq/L (ref 135–145)
TOTAL PROTEIN: 7.3 g/dL (ref 6.0–8.3)
Total Bilirubin: 0.7 mg/dL (ref 0.2–1.2)

## 2018-01-06 LAB — HEMOGLOBIN A1C: Hgb A1c MFr Bld: 5.5 % (ref 4.6–6.5)

## 2018-01-06 MED ORDER — LOSARTAN POTASSIUM 100 MG PO TABS: 100.0000 mg | ORAL_TABLET | Freq: Every day | ORAL | 3 refills | Status: DC

## 2018-01-06 MED ORDER — NADOLOL 20 MG PO TABS: 20.0000 mg | ORAL_TABLET | Freq: Every day | ORAL | 3 refills | Status: DC

## 2018-01-06 MED ORDER — SPIRONOLACTONE 25 MG PO TABS: 25.0000 mg | ORAL_TABLET | Freq: Two times a day (BID) | ORAL | 3 refills | Status: DC

## 2018-01-06 NOTE — Patient Instructions (Signed)
DASH Eating Plan DASH stands for "Dietary Approaches to Stop Hypertension." The DASH eating plan is a healthy eating plan that has been shown to reduce high blood pressure (hypertension). It may also reduce your risk for type 2 diabetes, heart disease, and stroke. The DASH eating plan may also help with weight loss. What are tips for following this plan? General guidelines  Avoid eating more than 2,300 mg (milligrams) of salt (sodium) a day. If you have hypertension, you may need to reduce your sodium intake to 1,500 mg a day.  Limit alcohol intake to no more than 1 drink a day for nonpregnant women and 2 drinks a day for men. One drink equals 12 oz of beer, 5 oz of wine, or 1 oz of hard liquor.  Work with your health care provider to maintain a healthy body weight or to lose weight. Ask what an ideal weight is for you.  Get at least 30 minutes of exercise that causes your heart to beat faster (aerobic exercise) most days of the week. Activities may include walking, swimming, or biking.  Work with your health care provider or diet and nutrition specialist (dietitian) to adjust your eating plan to your individual calorie needs. Reading food labels  Check food labels for the amount of sodium per serving. Choose foods with less than 5 percent of the Daily Value of sodium. Generally, foods with less than 300 mg of sodium per serving fit into this eating plan.  To find whole grains, look for the word "whole" as the first word in the ingredient list. Shopping  Buy products labeled as "low-sodium" or "no salt added."  Buy fresh foods. Avoid canned foods and premade or frozen meals. Cooking  Avoid adding salt when cooking. Use salt-free seasonings or herbs instead of table salt or sea salt. Check with your health care provider or pharmacist before using salt substitutes.  Do not fry foods. Cook foods using healthy methods such as baking, boiling, grilling, and broiling instead.  Cook with  heart-healthy oils, such as olive, canola, soybean, or sunflower oil. Meal planning   Eat a balanced diet that includes: ? 5 or more servings of fruits and vegetables each day. At each meal, try to fill half of your plate with fruits and vegetables. ? Up to 6-8 servings of whole grains each day. ? Less than 6 oz of lean meat, poultry, or fish each day. A 3-oz serving of meat is about the same size as a deck of cards. One egg equals 1 oz. ? 2 servings of low-fat dairy each day. ? A serving of nuts, seeds, or beans 5 times each week. ? Heart-healthy fats. Healthy fats called Omega-3 fatty acids are found in foods such as flaxseeds and coldwater fish, like sardines, salmon, and mackerel.  Limit how much you eat of the following: ? Canned or prepackaged foods. ? Food that is high in trans fat, such as fried foods. ? Food that is high in saturated fat, such as fatty meat. ? Sweets, desserts, sugary drinks, and other foods with added sugar. ? Full-fat dairy products.  Do not salt foods before eating.  Try to eat at least 2 vegetarian meals each week.  Eat more home-cooked food and less restaurant, buffet, and fast food.  When eating at a restaurant, ask that your food be prepared with less salt or no salt, if possible. What foods are recommended? The items listed may not be a complete list. Talk with your dietitian about what   dietary choices are best for you. Grains Whole-grain or whole-wheat bread. Whole-grain or whole-wheat pasta. Brown rice. Oatmeal. Quinoa. Bulgur. Whole-grain and low-sodium cereals. Pita bread. Low-fat, low-sodium crackers. Whole-wheat flour tortillas. Vegetables Fresh or frozen vegetables (raw, steamed, roasted, or grilled). Low-sodium or reduced-sodium tomato and vegetable juice. Low-sodium or reduced-sodium tomato sauce and tomato paste. Low-sodium or reduced-sodium canned vegetables. Fruits All fresh, dried, or frozen fruit. Canned fruit in natural juice (without  added sugar). Meat and other protein foods Skinless chicken or turkey. Ground chicken or turkey. Pork with fat trimmed off. Fish and seafood. Egg whites. Dried beans, peas, or lentils. Unsalted nuts, nut butters, and seeds. Unsalted canned beans. Lean cuts of beef with fat trimmed off. Low-sodium, lean deli meat. Dairy Low-fat (1%) or fat-free (skim) milk. Fat-free, low-fat, or reduced-fat cheeses. Nonfat, low-sodium ricotta or cottage cheese. Low-fat or nonfat yogurt. Low-fat, low-sodium cheese. Fats and oils Soft margarine without trans fats. Vegetable oil. Low-fat, reduced-fat, or light mayonnaise and salad dressings (reduced-sodium). Canola, safflower, olive, soybean, and sunflower oils. Avocado. Seasoning and other foods Herbs. Spices. Seasoning mixes without salt. Unsalted popcorn and pretzels. Fat-free sweets. What foods are not recommended? The items listed may not be a complete list. Talk with your dietitian about what dietary choices are best for you. Grains Baked goods made with fat, such as croissants, muffins, or some breads. Dry pasta or rice meal packs. Vegetables Creamed or fried vegetables. Vegetables in a cheese sauce. Regular canned vegetables (not low-sodium or reduced-sodium). Regular canned tomato sauce and paste (not low-sodium or reduced-sodium). Regular tomato and vegetable juice (not low-sodium or reduced-sodium). Pickles. Olives. Fruits Canned fruit in a light or heavy syrup. Fried fruit. Fruit in cream or butter sauce. Meat and other protein foods Fatty cuts of meat. Ribs. Fried meat. Bacon. Sausage. Bologna and other processed lunch meats. Salami. Fatback. Hotdogs. Bratwurst. Salted nuts and seeds. Canned beans with added salt. Canned or smoked fish. Whole eggs or egg yolks. Chicken or turkey with skin. Dairy Whole or 2% milk, cream, and half-and-half. Whole or full-fat cream cheese. Whole-fat or sweetened yogurt. Full-fat cheese. Nondairy creamers. Whipped toppings.  Processed cheese and cheese spreads. Fats and oils Butter. Stick margarine. Lard. Shortening. Ghee. Bacon fat. Tropical oils, such as coconut, palm kernel, or palm oil. Seasoning and other foods Salted popcorn and pretzels. Onion salt, garlic salt, seasoned salt, table salt, and sea salt. Worcestershire sauce. Tartar sauce. Barbecue sauce. Teriyaki sauce. Soy sauce, including reduced-sodium. Steak sauce. Canned and packaged gravies. Fish sauce. Oyster sauce. Cocktail sauce. Horseradish that you find on the shelf. Ketchup. Mustard. Meat flavorings and tenderizers. Bouillon cubes. Hot sauce and Tabasco sauce. Premade or packaged marinades. Premade or packaged taco seasonings. Relishes. Regular salad dressings. Where to find more information:  National Heart, Lung, and Blood Institute: www.nhlbi.nih.gov  American Heart Association: www.heart.org Summary  The DASH eating plan is a healthy eating plan that has been shown to reduce high blood pressure (hypertension). It may also reduce your risk for type 2 diabetes, heart disease, and stroke.  With the DASH eating plan, you should limit salt (sodium) intake to 2,300 mg a day. If you have hypertension, you may need to reduce your sodium intake to 1,500 mg a day.  When on the DASH eating plan, aim to eat more fresh fruits and vegetables, whole grains, lean proteins, low-fat dairy, and heart-healthy fats.  Work with your health care provider or diet and nutrition specialist (dietitian) to adjust your eating plan to your individual   calorie needs. This information is not intended to replace advice given to you by your health care provider. Make sure you discuss any questions you have with your health care provider. Document Released: 08/06/2011 Document Revised: 08/10/2016 Document Reviewed: 08/10/2016 Elsevier Interactive Patient Education  2018 Elsevier Inc.  

## 2018-01-06 NOTE — Assessment & Plan Note (Signed)
Encouraged heart healthy diet, increase exercise, avoid trans fats, consider a krill oil cap daily 

## 2018-01-06 NOTE — Assessment & Plan Note (Signed)
Well controlled, no changes to meds. Encouraged heart healthy diet such as the DASH diet and exercise as tolerated.  °

## 2018-01-06 NOTE — Progress Notes (Signed)
Patient ID: Shannon Austin, female   DOB: August 16, 1933, 84 y.o.   MRN: [id number]    Subjective:  I acted as a Education administrator for Dr. Carollee Herter.  Guerry Bruin, [place]   Patient ID: Shannon Austin, female    DOB: [birth year], 84 y.o.   MRN: [id number]  Chief Complaint  Patient presents with  . Hypertension    HPI  Patient is in today for follow up blood pressure.---  No complaints.    Patient Care Team: Carollee Herter, Alferd Apa, DO as PCP - General (Family Medicine) Puschinsky, Fransico Him., MD (General Surgery) Maggie Schwalbe., MD as Consulting Physician (Otolaryngology) Deirdre Pippins, PA-C as Physician Assistant (Internal Medicine)   Past Medical History:  Diagnosis Date  . Gross hematuria   . Hyperlipidemia   . Hypertension   . Osteoporosis   . Supraventricular tachycardia ([place])   . Vaginitis    atropic    Past Surgical History:  Procedure Laterality Date  . APPENDECTOMY    . CARPAL TUNNEL RELEASE     right  . CATARACT EXTRACTION     left eye  . CESAREAN SECTION    . CYSTOSCOPY  05/09/10, 09/11/10, 11/21/12, 01/01/15  . DILATION AND CURETTAGE OF UTERUS    . fibrocystic mass     right    Family History  Problem Relation Age of Onset  . Hypertension Mother   . Hypertension Father   . Diabetes Maternal Grandmother     Social History   Socioeconomic History  . Marital status: Single    Spouse name: Not on file  . Number of children: Not on file  . Years of education: Not on file  . Highest education level: Not on file  Occupational History  . Occupation: nursing- retired  Scientific laboratory technician  . Financial resource strain: Not on file  . Food insecurity:    Worry: Not on file    Inability: Not on file  . Transportation needs:    Medical: Not on file    Non-medical: Not on file  Tobacco Use  . Smoking status: Former Research scientist (life sciences)  . Smokeless tobacco: Never Used  Substance and Sexual Activity  . Alcohol use: Yes    Comment: rare  . Drug use: No  . Sexual activity: Not Currently    Lifestyle  . Physical activity:    Days per week: Not on file    Minutes per session: Not on file  . Stress: Not on file  Relationships  . Social connections:    Talks on phone: Not on file    Gets together: Not on file    Attends religious service: Not on file    Active member of club or organization: Not on file    Attends meetings of clubs or organizations: Not on file    Relationship status: Not on file  . Intimate partner violence:    Fear of current or ex partner: Not on file    Emotionally abused: Not on file    Physically abused: Not on file    Forced sexual activity: Not on file  Other Topics Concern  . Not on file  Social History Narrative   W[employer]ower (2000)    Outpatient Medications Prior to Visit  Medication Sig Dispense Refill  . Calcium Carbonate (CALCIUM 600 PO) Take 600 mg by mouth 2 (two) times daily.     Marland Kitchen CRANBERRY EXTRACT PO Take by mouth.    Adora Fridge VAGINAL 0.1 MG/GM vaginal cream Place 1 Applicatorful  vaginally 3 (three) times a week.     . fish oil-omega-3 fatty acids 1000 MG capsule Take 2 g by mouth 2 (two) times daily.      Marland Kitchen ipratropium (ATROVENT) 0.06 % nasal spray Place 2 sprays into the nose 4 (four) times daily.    . Multiple Vitamins-Minerals (ICAPS MV PO) Take 1 tablet by mouth 2 (two) times daily.    Marland Kitchen MYRBETRIQ 50 MG TB24 tablet Take 1 tablet by mouth daily.  5  . losartan (COZAAR) 100 MG tablet Take 1 tablet (100 mg total) by mouth daily. 90 tablet 3  . nadolol (CORGARD) 20 MG tablet Take 1 tablet (20 mg total) by mouth daily. 90 tablet 3  . spironolactone (ALDACTONE) 25 MG tablet Take 1 tablet (25 mg total) by mouth 2 (two) times daily. 180 tablet 3  . alendronate (FOSAMAX) 70 MG tablet TAKE 1 TABLET(70 MG) BY MOUTH EVERY 7 DAYS WITH A FULL GLASS OF WATER AND ON AN EMPTY STOMACH 12 tablet 3   No facility-administered medications prior to visit.     Allergies  Allergen Reactions  . Amlodipine Besylate   . Ezetimibe   .  Hydrochlorothiazide   . Latex   . Lisinopril Cough  . Statins     Muscle weakness  . Sulfonamide Derivatives Rash and Other (See Comments)    HA    Review of Systems  Constitutional: Negative for fever and malaise/fatigue.  HENT: Negative for congestion.   Eyes: Negative for blurred vision.  Respiratory: Negative for cough and shortness of breath.   Cardiovascular: Negative for chest pain, palpitations and leg swelling.  Gastrointestinal: Negative for vomiting.  Musculoskeletal: Negative for back pain.  Skin: Negative for rash.  Neurological: Negative for loss of consciousness and headaches.       Objective:    Physical Exam  Constitutional: She is oriented to person, place, and time. She appears well-developed and well-nourished.  HENT:  Head: Normocephalic and atraumatic.  Eyes: Conjunctivae and EOM are normal.  Neck: Normal range of motion. Neck supple. No JVD present. Carotid bruit is not present. No thyromegaly present.  Cardiovascular: Normal rate, regular rhythm and normal heart sounds.  No murmur heard. Pulmonary/Chest: Effort normal and breath sounds normal. No respiratory distress. She has no wheezes. She has no rales. She exhibits no tenderness.  Musculoskeletal: She exhibits no edema.  Neurological: She is alert and oriented to person, place, and time.  Psychiatric: She has a normal mood and affect.  Nursing note and vitals reviewed.   BP 128/88 (BP Location: Right Arm, Cuff Size: Normal)   Pulse 78   Temp 98 F (36.7 C) (Oral)   Resp 16   Ht 4\' 10"  (1.473 m)   Wt 121 lb 6.4 oz (55.1 kg)   SpO2 98%   BMI 25.37 kg/m  Wt Readings from Last 3 Encounters:  01/06/18 121 lb 6.4 oz (55.1 kg)  05/04/17 121 lb 9.6 oz (55.2 kg)  10/06/16 119 lb 6.4 oz (54.2 kg)   BP Readings from Last 3 Encounters:  01/06/18 128/88  05/04/17 112/74  10/06/16 121/78     Immunization History  Administered Date(s) Administered  . Influenza Split 05/10/2013  . Influenza  Whole 06/14/2009, 06/23/2010  . Influenza, High Dose Seasonal PF 05/04/2017  . Influenza,inj,Quad PF,6+ Mos 06/01/2014, 05/08/2015  . Pneumococcal Conjugate-13 10/31/2014  . Pneumococcal Polysaccharide-23 04/02/2010  . Zoster 08/31/2012    Health Maintenance  Topic Date Due  . Samul Dada  03/21/1952  .  DEXA SCAN  04/04/2017  . MAMMOGRAM  06/25/2017  . INFLUENZA VACCINE  03/31/2018  . PNA vac Low Risk Adult  Completed    Lab Results  Component Value Date   WBC 6.4 05/04/2017   HGB 15.1 (H) 05/04/2017   HCT 45.8 05/04/2017   PLT 236.0 05/04/2017   GLUCOSE 112 (H) 05/04/2017   CHOL 195 05/04/2017   TRIG 58.0 05/04/2017   HDL 67.50 05/04/2017   LDLDIRECT 125.8 05/03/2013   LDLCALC 115 (H) 05/04/2017   ALT 15 05/04/2017   AST 20 05/04/2017   NA 137 05/04/2017   K 4.8 05/04/2017   CL 101 05/04/2017   CREATININE 0.66 05/04/2017   BUN 21 05/04/2017   CO2 27 05/04/2017   TSH 1.57 10/31/2014   INR 1.0 07/19/2008   HGBA1C 5.7 04/02/2010    Lab Results  Component Value Date   TSH 1.57 10/31/2014   Lab Results  Component Value Date   WBC 6.4 05/04/2017   HGB 15.1 (H) 05/04/2017   HCT 45.8 05/04/2017   MCV 100.8 (H) 05/04/2017   PLT 236.0 05/04/2017   Lab Results  Component Value Date   NA 137 05/04/2017   K 4.8 05/04/2017   CO2 27 05/04/2017   GLUCOSE 112 (H) 05/04/2017   BUN 21 05/04/2017   CREATININE 0.66 05/04/2017   BILITOT 0.7 05/04/2017   ALKPHOS 79 05/04/2017   AST 20 05/04/2017   ALT 15 05/04/2017   PROT 7.2 05/04/2017   ALBUMIN 4.3 05/04/2017   CALCIUM 10.1 05/04/2017   GFR 91.10 05/04/2017   Lab Results  Component Value Date   CHOL 195 05/04/2017   Lab Results  Component Value Date   HDL 67.50 05/04/2017   Lab Results  Component Value Date   LDLCALC 115 (H) 05/04/2017   Lab Results  Component Value Date   TRIG 58.0 05/04/2017   Lab Results  Component Value Date   CHOLHDL 3 05/04/2017   Lab Results  Component Value Date   HGBA1C  5.7 04/02/2010         Assessment & Plan:   Problem List Items Addressed This Visit      Unprioritized   Hyperlipidemia    Encouraged heart healthy diet, increase exercise, avoid trans fats, consider a krill oil cap daily      Relevant Medications   losartan (COZAAR) 100 MG tablet   nadolol (CORGARD) 20 MG tablet   spironolactone (ALDACTONE) 25 MG tablet   HYPERTENSION, BENIGN    Well controlled, no changes to meds. Encouraged heart healthy diet such as the DASH diet and exercise as tolerated.       Relevant Medications   losartan (COZAAR) 100 MG tablet   nadolol (CORGARD) 20 MG tablet   spironolactone (ALDACTONE) 25 MG tablet    Other Visit Diagnoses    Hyperglycemia    -  Primary   Relevant Orders   Comprehensive metabolic panel   Lipid panel   Hemoglobin A1c   Essential hypertension       Relevant Medications   losartan (COZAAR) 100 MG tablet   nadolol (CORGARD) 20 MG tablet   spironolactone (ALDACTONE) 25 MG tablet   Other Relevant Orders   Lipid panel      I have discontinued Wallie Char. Schrager's alendronate. I am also having her maintain her Calcium Carbonate (CALCIUM 600 PO), fish oil-omega-3 fatty acids, ESTRACE VAGINAL, CRANBERRY EXTRACT PO, Multiple Vitamins-Minerals (ICAPS MV PO), MYRBETRIQ, ipratropium, losartan, nadolol, and spironolactone.  Meds ordered  this encounter  Medications  . losartan (COZAAR) 100 MG tablet    Sig: Take 1 tablet (100 mg total) by mouth daily.    Dispense:  90 tablet    Refill:  3  . nadolol (CORGARD) 20 MG tablet    Sig: Take 1 tablet (20 mg total) by mouth daily.    Dispense:  90 tablet    Refill:  3  . spironolactone (ALDACTONE) 25 MG tablet    Sig: Take 1 tablet (25 mg total) by mouth 2 (two) times daily.    Dispense:  180 tablet    Refill:  3    CMA served as scribe during this visit. History, Physical and Plan performed by medical provider. Documentation and orders reviewed and attested to.  Ann Held,  DO

## 2018-03-31 DIAGNOSIS — N3010 Interstitial cystitis (chronic) without hematuria: Secondary | ICD-10-CM | POA: Diagnosis not present

## 2018-04-07 DIAGNOSIS — Z1231 Encounter for screening mammogram for malignant neoplasm of breast: Secondary | ICD-10-CM | POA: Diagnosis not present

## 2018-04-07 LAB — HM MAMMOGRAPHY

## 2018-04-11 ENCOUNTER — Encounter: Payer: Self-pay | Admitting: *Deleted

## 2018-04-13 DIAGNOSIS — H60542 Acute eczematoid otitis externa, left ear: Secondary | ICD-10-CM | POA: Diagnosis not present

## 2018-04-13 DIAGNOSIS — H6123 Impacted cerumen, bilateral: Secondary | ICD-10-CM | POA: Diagnosis not present

## 2018-04-13 DIAGNOSIS — Z87891 Personal history of nicotine dependence: Secondary | ICD-10-CM | POA: Diagnosis not present

## 2018-05-08 ENCOUNTER — Other Ambulatory Visit: Payer: Self-pay | Admitting: Family Medicine

## 2018-05-08 DIAGNOSIS — I10 Essential (primary) hypertension: Secondary | ICD-10-CM

## 2018-05-20 ENCOUNTER — Other Ambulatory Visit: Payer: Self-pay | Admitting: Family Medicine

## 2018-05-20 DIAGNOSIS — I10 Essential (primary) hypertension: Secondary | ICD-10-CM

## 2018-07-19 ENCOUNTER — Encounter: Payer: PPO | Admitting: Family Medicine

## 2018-07-30 ENCOUNTER — Other Ambulatory Visit: Payer: Self-pay | Admitting: Family Medicine

## 2018-07-30 DIAGNOSIS — I10 Essential (primary) hypertension: Secondary | ICD-10-CM

## 2018-08-01 ENCOUNTER — Other Ambulatory Visit: Payer: Self-pay

## 2018-08-05 ENCOUNTER — Other Ambulatory Visit: Payer: Self-pay | Admitting: *Deleted

## 2018-08-05 DIAGNOSIS — I10 Essential (primary) hypertension: Secondary | ICD-10-CM

## 2018-08-05 MED ORDER — SPIRONOLACTONE 25 MG PO TABS: 25.0000 mg | ORAL_TABLET | Freq: Two times a day (BID) | ORAL | 3 refills | Status: DC

## 2018-08-09 ENCOUNTER — Other Ambulatory Visit: Payer: Self-pay | Admitting: Family Medicine

## 2018-08-09 DIAGNOSIS — I10 Essential (primary) hypertension: Secondary | ICD-10-CM

## 2018-08-24 ENCOUNTER — Other Ambulatory Visit: Payer: Self-pay | Admitting: Family Medicine

## 2018-08-24 DIAGNOSIS — I10 Essential (primary) hypertension: Secondary | ICD-10-CM

## 2018-09-19 ENCOUNTER — Ambulatory Visit (INDEPENDENT_AMBULATORY_CARE_PROVIDER_SITE_OTHER): Payer: PPO | Admitting: Family Medicine

## 2018-09-19 ENCOUNTER — Encounter: Payer: Self-pay | Admitting: Family Medicine

## 2018-09-19 VITALS — BP 106/70 | HR 71 | Temp 97.9°F | Resp 16 | Ht <= 58 in | Wt 127.6 lb

## 2018-09-19 DIAGNOSIS — I10 Essential (primary) hypertension: Secondary | ICD-10-CM | POA: Diagnosis not present

## 2018-09-19 DIAGNOSIS — M810 Age-related osteoporosis without current pathological fracture: Secondary | ICD-10-CM | POA: Diagnosis not present

## 2018-09-19 MED ORDER — NADOLOL 20 MG PO TABS: ORAL_TABLET | ORAL | 3 refills | Status: DC

## 2018-09-19 MED ORDER — SPIRONOLACTONE 25 MG PO TABS: 25.0000 mg | ORAL_TABLET | Freq: Once | ORAL | 3 refills | Status: DC

## 2018-09-19 MED ORDER — LOSARTAN POTASSIUM 100 MG PO TABS: ORAL_TABLET | ORAL | 3 refills | Status: DC

## 2018-09-19 NOTE — Progress Notes (Signed)
Subjective:     Shannon Austin is a 85 y.o. female and is here for a comprehensive physical exam. The patient reports no problems.  Social History   Socioeconomic History  . Marital status: Single    Spouse name: Not on file  . Number of children: Not on file  . Years of education: Not on file  . Highest education level: Not on file  Occupational History  . Occupation: nursing- retired  Scientific laboratory technician  . Financial resource strain: Not on file  . Food insecurity:    Worry: Not on file    Inability: Not on file  . Transportation needs:    Medical: Not on file    Non-medical: Not on file  Tobacco Use  . Smoking status: Former Research scientist (life sciences)  . Smokeless tobacco: Never Used  Substance and Sexual Activity  . Alcohol use: Yes    Comment: rare  . Drug use: No  . Sexual activity: Not Currently  Lifestyle  . Physical activity:    Days per week: Not on file    Minutes per session: Not on file  . Stress: Not on file  Relationships  . Social connections:    Talks on phone: Not on file    Gets together: Not on file    Attends religious service: Not on file    Active member of club or organization: Not on file    Attends meetings of clubs or organizations: Not on file    Relationship status: Not on file  . Intimate partner violence:    Fear of current or ex partner: Not on file    Emotionally abused: Not on file    Physically abused: Not on file    Forced sexual activity: Not on file  Other Topics Concern  . Not on file  Social History Narrative   Widower (2000)   Health Maintenance  Topic Date Due  . Samul Dada  03/21/1952  . DEXA SCAN  04/04/2017  . MAMMOGRAM  04/08/2019  . INFLUENZA VACCINE  Completed  . PNA vac Low Risk Adult  Completed    The following portions of the patient's history were reviewed and updated as appropriate:  She  has a past medical history of Gross hematuria, Hyperlipidemia, Hypertension, Osteoporosis, Supraventricular tachycardia ([place]), and  Vaginitis. She does not have any pertinent problems on file. She  has a past surgical history that includes Cesarean section; Dilation and curettage of uterus; fibrocystic mass; Appendectomy; Cataract extraction; Carpal tunnel release; and Cystoscopy (05/09/10, 09/11/10, 11/21/12, 01/01/15). Her family history includes Diabetes in her maternal grandmother; Hypertension in her father and mother. She  reports that she has quit smoking. She has never used smokeless tobacco. She reports current alcohol use. She reports that she does not use drugs. She has a current medication list which includes the following prescription(s): estrace vaginal, fish oil-omega-3 fatty acids, ipratropium, losartan, multiple vitamins-minerals, myrbetriq, nadolol, and spironolactone. Current Outpatient Medications on File Prior to Visit  Medication Sig Dispense Refill  . ESTRACE VAGINAL 0.1 MG/GM vaginal cream Place 1 Applicatorful vaginally 3 (three) times a week.     . fish oil-omega-3 fatty acids 1000 MG capsule Take 2 g by mouth 2 (two) times daily.      Marland Kitchen ipratropium (ATROVENT) 0.06 % nasal spray Place 2 sprays into both nostrils 4 (four) times daily as needed for rhinitis.    . Multiple Vitamins-Minerals (PRESERVISION AREDS PO) Take 1 tablet by mouth daily.    Marland Kitchen MYRBETRIQ 50 MG TB24  tablet Take 1 tablet by mouth daily.  5   No current facility-administered medications on file prior to visit.    She is allergic to amlodipine besylate; ezetimibe; hydrochlorothiazide; latex; lisinopril; statins; and sulfonamide derivatives..  Review of Systems Review of Systems  Constitutional: Negative for activity change, appetite change and fatigue.  HENT: Negative for hearing loss, congestion, tinnitus and ear discharge.  dentist q70m Eyes: Negative for visual disturbance (see optho q1y -- vision corrected to 20/20 with glasses).  Respiratory: Negative for cough, chest tightness and shortness of breath.   Cardiovascular: Negative for  chest pain, palpitations and leg swelling.  Gastrointestinal: Negative for abdominal pain, diarrhea, constipation and abdominal distention.  Genitourinary: Negative for urgency, frequency, decreased urine volume and difficulty urinating.  Musculoskeletal: Negative for back pain, arthralgias and gait problem.  Skin: Negative for color change, pallor and rash.  Neurological: Negative for dizziness, light-headedness, numbness and headaches.  Hematological: Negative for adenopathy. Does not bruise/bleed easily.  Psychiatric/Behavioral: Negative for suicidal ideas, confusion, sleep disturbance, self-injury, dysphoric mood, decreased concentration and agitation.       Objective:    BP 106/70 (BP Location: Right Arm, Cuff Size: Normal)   Pulse 71   Temp 97.9 F (36.6 C) (Oral)   Resp 16   Ht 4\' 10"  (1.473 m)   Wt 127 lb 9.6 oz (57.9 kg)   SpO2 98%   BMI 26.67 kg/m  General appearance: alert, cooperative, appears stated age and no distress Head: Normocephalic, without obvious abnormality, atraumatic Eyes: conjunctivae/corneas clear. PERRL, EOM's intact. Fundi benign. Ears: normal TM's and external ear canals both ears Nose: Nares normal. Septum midline. Mucosa normal. No drainage or sinus tenderness. Throat: lips, mucosa, and tongue normal; teeth and gums normal Neck: no adenopathy, no carotid bruit, no JVD, supple, symmetrical, trachea midline and thyroid not enlarged, symmetric, no tenderness/mass/nodules Back: symmetric, no curvature. ROM normal. No CVA tenderness. Lungs: clear to auscultation bilaterally Breasts: normal appearance, no masses or tenderness Heart: regular rate and rhythm, S1, S2 normal, no murmur, click, rub or gallop Abdomen: soft, non-tender; bowel sounds normal; no masses,  no organomegaly Pelvic: not indicated; post-menopausal, no abnormal Pap smears in past Extremities: extremities normal, atraumatic, no cyanosis or edema Pulses: 2+ and symmetric Skin: Skin  color, texture, turgor normal. No rashes or lesions Lymph nodes: Cervical, supraclavicular, and axillary nodes normal. Neurologic: Alert and oriented X 3, normal strength and tone. Normal symmetric reflexes. Normal coordination and gait    Assessment:    Healthy female exam.      Plan:    ghm utd Check labs See After Visit Summary for Counseling Recommendations    1. Essential hypertension Running low-- dec spironolactone to 1x a day - spironolactone (ALDACTONE) 25 MG tablet; Take 1 tablet (25 mg total) by mouth once for 1 dose.  Dispense: 90 tablet; Refill: 3 - nadolol (CORGARD) 20 MG tablet; TAKE 1 TABLET(20 MG) BY MOUTH DAILY  Dispense: 90 tablet; Refill: 3 - losartan (COZAAR) 100 MG tablet; TAKE 1 TABLET(100 MG) BY MOUTH DAILY  Dispense: 90 tablet; Refill: 3 - Lipid panel - CBC with Differential/Platelet - Comprehensive metabolic panel  2. Age-related osteoporosis without current pathological fracture  - DG Bone Density; Future

## 2018-09-19 NOTE — Patient Instructions (Signed)
Preventive Care 42 Years and Older, Female Preventive care refers to lifestyle choices and visits with your health care provider that can promote health and wellness. What does preventive care include?  A yearly physical exam. This is also called an annual well check.  Dental exams once or twice a year.  Routine eye exams. Ask your health care provider how often you should have your eyes checked.  Personal lifestyle choices, including: ? Daily care of your teeth and gums. ? Regular physical activity. ? Eating a healthy diet. ? Avoiding tobacco and drug use. ? Limiting alcohol use. ? Practicing safe sex. ? Taking low-dose aspirin every day. ? Taking vitamin and mineral supplements as recommended by your health care provider. What happens during an annual well check? The services and screenings done by your health care provider during your annual well check will depend on your age, overall health, lifestyle risk factors, and family history of disease. Counseling Your health care provider may ask you questions about your:  Alcohol use.  Tobacco use.  Drug use.  Emotional well-being.  Home and relationship well-being.  Sexual activity.  Eating habits.  History of falls.  Memory and ability to understand (cognition).  Work and work Statistician.  Reproductive health.  Screening You may have the following tests or measurements:  Height, weight, and BMI.  Blood pressure.  Lipid and cholesterol levels. These may be checked every 5 years, or more frequently if you are over 30 years old.  Skin check.  Lung cancer screening. You may have this screening every year starting at age 27 if you have a 30-pack-year history of smoking and currently smoke or have quit within the past 15 years.  Colorectal cancer screening. All adults should have this screening starting at age 33 and continuing until age 46. You will have tests every 1-10 years, depending on your results and the  type of screening test. People at increased risk should start screening at an earlier age. Screening tests may include: ? Guaiac-based fecal occult blood testing. ? Fecal immunochemical test (FIT). ? Stool DNA test. ? Virtual colonoscopy. ? Sigmoidoscopy. During this test, a flexible tube with a tiny camera (sigmoidoscope) is used to examine your rectum and lower colon. The sigmoidoscope is inserted through your anus into your rectum and lower colon. ? Colonoscopy. During this test, a long, thin, flexible tube with a tiny camera (colonoscope) is used to examine your entire colon and rectum.  Hepatitis C blood test.  Hepatitis B blood test.  Sexually transmitted disease (STD) testing.  Diabetes screening. This is done by checking your blood sugar (glucose) after you have not eaten for a while (fasting). You may have this done every 1-3 years.  Bone density scan. This is done to screen for osteoporosis. You may have this done starting at age 37.  Mammogram. This may be done every 1-2 years. Talk to your health care provider about how often you should have regular mammograms. Talk with your health care provider about your test results, treatment options, and if necessary, the need for more tests. Vaccines Your health care provider may recommend certain vaccines, such as:  Influenza vaccine. This is recommended every year.  Tetanus, diphtheria, and acellular pertussis (Tdap, Td) vaccine. You may need a Td booster every 10 years.  Varicella vaccine. You may need this if you have not been vaccinated.  Zoster vaccine. You may need this after age 38.  Measles, mumps, and rubella (MMR) vaccine. You may need at least  one dose of MMR if you were born in 1957 or later. You may also need a second dose.  Pneumococcal 13-valent conjugate (PCV13) vaccine. One dose is recommended after age 24.  Pneumococcal polysaccharide (PPSV23) vaccine. One dose is recommended after age 24.  Meningococcal  vaccine. You may need this if you have certain conditions.  Hepatitis A vaccine. You may need this if you have certain conditions or if you travel or work in places where you may be exposed to hepatitis A.  Hepatitis B vaccine. You may need this if you have certain conditions or if you travel or work in places where you may be exposed to hepatitis B.  Haemophilus influenzae type b (Hib) vaccine. You may need this if you have certain conditions. Talk to your health care provider about which screenings and vaccines you need and how often you need them. This information is not intended to replace advice given to you by your health care provider. Make sure you discuss any questions you have with your health care provider. Document Released: 09/13/2015 Document Revised: 10/07/2017 Document Reviewed: 06/18/2015 Elsevier Interactive Patient Education  2019 [employer].

## 2018-09-20 ENCOUNTER — Other Ambulatory Visit: Payer: Self-pay | Admitting: *Deleted

## 2018-09-20 DIAGNOSIS — I10 Essential (primary) hypertension: Secondary | ICD-10-CM

## 2018-09-20 LAB — CBC WITH DIFFERENTIAL/PLATELET
BASOS PCT: 1.1 % (ref 0.0–3.0)
Basophils Absolute: 0.1 10*3/uL (ref 0.0–0.1)
Eosinophils Absolute: 0.2 10*3/uL (ref 0.0–0.7)
Eosinophils Relative: 2.2 % (ref 0.0–5.0)
HCT: 43 % (ref 36.0–46.0)
Hemoglobin: 14.4 g/dL (ref 12.0–15.0)
LYMPHS ABS: 1.7 10*3/uL (ref 0.7–4.0)
Lymphocytes Relative: 20.8 % (ref 12.0–46.0)
MCHC: 33.6 g/dL (ref 30.0–36.0)
MCV: 99.5 fl (ref 78.0–100.0)
Monocytes Absolute: 0.8 10*3/uL (ref 0.1–1.0)
Monocytes Relative: 9.8 % (ref 3.0–12.0)
Neutro Abs: 5.4 10*3/uL (ref 1.4–7.7)
Neutrophils Relative %: 66.1 % (ref 43.0–77.0)
Platelets: 247 10*3/uL (ref 150.0–400.0)
RBC: 4.32 Mil/uL (ref 3.87–5.11)
RDW: 14.3 % (ref 11.5–15.5)
WBC: 8.2 10*3/uL (ref 4.0–10.5)

## 2018-09-20 LAB — COMPREHENSIVE METABOLIC PANEL
ALT: 14 U/L (ref 0–35)
AST: 21 U/L (ref 0–37)
Albumin: 4.3 g/dL (ref 3.5–5.2)
Alkaline Phosphatase: 76 U/L (ref 39–117)
BUN: 32 mg/dL — ABNORMAL HIGH (ref 6–23)
CO2: 28 mEq/L (ref 19–32)
Calcium: 10.5 mg/dL (ref 8.4–10.5)
Chloride: 101 mEq/L (ref 96–112)
Creatinine, Ser: 1.01 mg/dL (ref 0.40–1.20)
GFR: 52.28 mL/min — AB (ref >60.00)
Glucose, Bld: 111 mg/dL — ABNORMAL HIGH (ref 70–99)
Potassium: 5.6 mEq/L — ABNORMAL HIGH (ref 3.5–5.1)
Sodium: 137 mEq/L (ref 135–145)
Total Bilirubin: 0.5 mg/dL (ref 0.2–1.2)
Total Protein: 7.2 g/dL (ref 6.0–8.3)

## 2018-09-20 LAB — LIPID PANEL
CHOL/HDL RATIO: 3
Cholesterol: 205 mg/dL — ABNORMAL HIGH (ref 0–200)
HDL: 64.1 mg/dL (ref >39.00)
LDL CALC: 123 mg/dL — AB (ref 0–99)
NONHDL: 140.96
TRIGLYCERIDES: 91 mg/dL (ref 0.0–149.0)
VLDL: 18.2 mg/dL (ref 0.0–40.0)

## 2018-09-20 MED ORDER — SPIRONOLACTONE 25 MG PO TABS: 25.0000 mg | ORAL_TABLET | Freq: Every day | ORAL | 3 refills | Status: DC

## 2018-09-23 ENCOUNTER — Other Ambulatory Visit: Payer: Self-pay | Admitting: *Deleted

## 2018-09-23 DIAGNOSIS — E875 Hyperkalemia: Secondary | ICD-10-CM

## 2018-09-23 DIAGNOSIS — I10 Essential (primary) hypertension: Secondary | ICD-10-CM

## 2018-09-28 ENCOUNTER — Telehealth: Payer: Self-pay | Admitting: *Deleted

## 2018-09-28 NOTE — Telephone Encounter (Signed)
Copied from [place] [id number]. Topic: General - Inquiry >> Sep 27, 2018 12:27 PM Alanda Slim E wrote: Reason for CRM: Dr. Valere Dross from [facility] [phone]   Dr. Valere Dross advise Pt to call PCP office and suggested Pt to have Xray of Low back and Bilateral hip/ as well as a medrol dose pack and PT order if warranted/ please advise

## 2018-09-28 NOTE — Telephone Encounter (Signed)
Are you ok with ordering this

## 2018-09-29 ENCOUNTER — Telehealth: Payer: Self-pay | Admitting: *Deleted

## 2018-09-29 NOTE — Telephone Encounter (Signed)
Carollee Herter, Alferd Apa, DO  Kem Boroughs D, CMA        I would need to see the pt to order these things --- see pt call

## 2018-09-29 NOTE — Telephone Encounter (Signed)
-----   Message from Ann Held, [place] sent at 09/28/2018 12:47 PM EST ----- I would need to see the pt to order these things --- see pt call

## 2018-09-29 NOTE — Telephone Encounter (Signed)
Error

## 2018-10-03 NOTE — Telephone Encounter (Signed)
Spoke with patient advised that we will need to see her.  appt made for 10/13/18

## 2018-10-13 ENCOUNTER — Ambulatory Visit (INDEPENDENT_AMBULATORY_CARE_PROVIDER_SITE_OTHER): Payer: PPO | Admitting: Family Medicine

## 2018-10-13 ENCOUNTER — Ambulatory Visit (HOSPITAL_BASED_OUTPATIENT_CLINIC_OR_DEPARTMENT_OTHER)
Admission: RE | Admit: 2018-10-13 | Discharge: 2018-10-13 | Disposition: A | Discharge status: Home / Self Care | Payer: PPO | Source: Ambulatory Visit | Attending: Family Medicine | Admitting: Family Medicine

## 2018-10-13 ENCOUNTER — Encounter: Payer: Self-pay | Admitting: Family Medicine

## 2018-10-13 VITALS — BP 128/76 | HR 70 | Temp 98.4°F | Resp 16 | Ht <= 58 in | Wt 129.2 lb

## 2018-10-13 DIAGNOSIS — M1611 Unilateral primary osteoarthritis, right hip: Secondary | ICD-10-CM | POA: Diagnosis not present

## 2018-10-13 DIAGNOSIS — G8929 Other chronic pain: Secondary | ICD-10-CM

## 2018-10-13 DIAGNOSIS — M47816 Spondylosis without myelopathy or radiculopathy, lumbar region: Secondary | ICD-10-CM | POA: Diagnosis not present

## 2018-10-13 DIAGNOSIS — M5441 Lumbago with sciatica, right side: Secondary | ICD-10-CM | POA: Insufficient documentation

## 2018-10-13 MED ORDER — PREDNISONE 10 MG PO TABS: ORAL_TABLET | ORAL | 0 refills | Status: DC

## 2018-10-13 MED ORDER — MELOXICAM 7.5 MG PO TABS: ORAL_TABLET | ORAL | 2 refills | Status: DC

## 2018-10-13 NOTE — Patient Instructions (Signed)

## 2018-10-13 NOTE — Progress Notes (Signed)
Patient ID: KMARI BRIAN, female    DOB: August 20, 1933  Age: 85 y.o. MRN: [id number]    Subjective:  Subjective  HPI ALLEENE STOY presents for r hip and low back pain x few weeks.  Her chiropractor wants her to have xrays and maybe pred pack.  She does not remember an injury  Review of Systems  Constitutional: Negative for appetite change, diaphoresis, fatigue and unexpected weight change.  Eyes: Negative for pain, redness and visual disturbance.  Respiratory: Negative for cough, chest tightness, shortness of breath and wheezing.   Cardiovascular: Negative for chest pain, palpitations and leg swelling.  Endocrine: Negative for cold intolerance, heat intolerance, polydipsia, polyphagia and polyuria.  Genitourinary: Negative for difficulty urinating, dysuria and frequency.  Musculoskeletal: Positive for back pain and gait problem.  Neurological: Negative for dizziness, light-headedness, numbness and headaches.    History Past Medical History:  Diagnosis Date  . Gross hematuria   . Hyperlipidemia   . Hypertension   . Osteoporosis   . Supraventricular tachycardia ([place])   . Vaginitis    atropic    She has a past surgical history that includes Cesarean section; Dilation and curettage of uterus; fibrocystic mass; Appendectomy; Cataract extraction; Carpal tunnel release; and Cystoscopy (05/09/10, 09/11/10, 11/21/12, 01/01/15).   Her family history includes Diabetes in her maternal grandmother; Hypertension in her father and mother.She reports that she has quit smoking. She has never used smokeless tobacco. She reports current alcohol use. She reports that she does not use drugs.  Current Outpatient Medications on File Prior to Visit  Medication Sig Dispense Refill  . ESTRACE VAGINAL 0.1 MG/GM vaginal cream Place 1 Applicatorful vaginally 3 (three) times a week.     . fish oil-omega-3 fatty acids 1000 MG capsule Take 2 g by mouth 2 (two) times daily.      Marland Kitchen ipratropium (ATROVENT) 0.06 % nasal  spray Place 2 sprays into both nostrils 4 (four) times daily as needed for rhinitis.    Marland Kitchen losartan (COZAAR) 100 MG tablet TAKE 1 TABLET(100 MG) BY MOUTH DAILY 90 tablet 3  . Multiple Vitamins-Minerals (PRESERVISION AREDS PO) Take 1 tablet by mouth daily.    Marland Kitchen MYRBETRIQ 50 MG TB24 tablet Take 1 tablet by mouth daily.  5  . nadolol (CORGARD) 20 MG tablet TAKE 1 TABLET(20 MG) BY MOUTH DAILY 90 tablet 3  . spironolactone (ALDACTONE) 25 MG tablet Take 1 tablet (25 mg total) by mouth daily. 90 tablet 3   No current facility-administered medications on file prior to visit.      Objective:  Objective  Physical Exam Vitals signs and nursing note reviewed.  Constitutional:      Appearance: She is well-developed.  HENT:     Head: Normocephalic and atraumatic.  Eyes:     Conjunctiva/sclera: Conjunctivae normal.  Neck:     Musculoskeletal: Normal range of motion and neck supple.     Thyroid: No thyromegaly.     Vascular: No carotid bruit or JVD.  Cardiovascular:     Rate and Rhythm: Normal rate and regular rhythm.     Heart sounds: Normal heart sounds. No murmur.  Pulmonary:     Effort: Pulmonary effort is normal. No respiratory distress.     Breath sounds: Normal breath sounds. No wheezing or rales.  Chest:     Chest wall: No tenderness.  Musculoskeletal:        General: Tenderness present.     Right hip: She exhibits decreased strength. She exhibits normal range  of motion and no tenderness.     Lumbar back: She exhibits tenderness, pain and spasm.  Neurological:     Mental Status: She is alert and oriented to person, place, and time.     Motor: Weakness present.     Gait: Gait normal.     Deep Tendon Reflexes: Reflexes normal.    BP 128/76 (BP Location: Right Arm, Cuff Size: Normal)   Pulse 70   Temp 98.4 F (36.9 C) (Oral)   Resp 16   Ht 4\' 10"  (1.473 m)   Wt 129 lb 3.2 oz (58.6 kg)   SpO2 98%   BMI 27.00 kg/m  Wt Readings from Last 3 Encounters:  10/13/18 129 lb 3.2 oz  (58.6 kg)  09/19/18 127 lb 9.6 oz (57.9 kg)  01/06/18 121 lb 6.4 oz (55.1 kg)     Lab Results  Component Value Date   WBC 8.2 09/19/2018   HGB 14.4 09/19/2018   HCT 43.0 09/19/2018   PLT 247.0 09/19/2018   GLUCOSE 111 (H) 09/19/2018   CHOL 205 (H) 09/19/2018   TRIG 91.0 09/19/2018   HDL 64.10 09/19/2018   LDLDIRECT 125.8 05/03/2013   LDLCALC 123 (H) 09/19/2018   ALT 14 09/19/2018   AST 21 09/19/2018   NA 137 09/19/2018   K 5.6 (H) 09/19/2018   CL 101 09/19/2018   CREATININE 1.01 09/19/2018   BUN 32 (H) 09/19/2018   CO2 28 09/19/2018   TSH 1.57 10/31/2014   INR 1.0 07/19/2008   HGBA1C 5.5 01/06/2018    No results found.   Assessment & Plan:  Plan  I am having Wallie Char. Labrecque start on predniSONE and meloxicam. I am also having her maintain her fish oil-omega-3 fatty acids, ESTRACE VAGINAL, MYRBETRIQ, Multiple Vitamins-Minerals (PRESERVISION AREDS PO), nadolol, losartan, ipratropium, and spironolactone.  Meds ordered this encounter  Medications  . predniSONE (DELTASONE) 10 MG tablet    Sig: TAKE 3 TABLETS PO QD FOR 3 DAYS THEN TAKE 2 TABLETS PO QD FOR 3 DAYS THEN TAKE 1 TABLET PO QD FOR 3 DAYS THEN TAKE 1/2 TAB PO QD FOR 3 DAYS    Dispense:  20 tablet    Refill:  0  . meloxicam (MOBIC) 7.5 MG tablet    Sig: 1-2po qd    Dispense:  60 tablet    Refill:  2    Problem List Items Addressed This Visit    None    Visit Diagnoses    Chronic right-sided low back pain with right-sided sciatica    -  Primary   Relevant Medications   predniSONE (DELTASONE) 10 MG tablet   meloxicam (MOBIC) 7.5 MG tablet   Other Relevant Orders   DG Hip Unilat W OR W/O Pelvis 2-3 Views Right   DG Lumbar Spine 2-3 Views   Ambulatory referral to Physical Therapy    consider referral vs PT  Follow-up: Return if symptoms worsen or fail to improve.  Ann Held, DO

## 2018-10-19 DIAGNOSIS — H6122 Impacted cerumen, left ear: Secondary | ICD-10-CM | POA: Diagnosis not present

## 2018-10-19 DIAGNOSIS — M545 Low back pain: Secondary | ICD-10-CM | POA: Diagnosis not present

## 2018-10-19 DIAGNOSIS — H6121 Impacted cerumen, right ear: Secondary | ICD-10-CM | POA: Diagnosis not present

## 2018-10-19 DIAGNOSIS — R262 Difficulty in walking, not elsewhere classified: Secondary | ICD-10-CM | POA: Diagnosis not present

## 2018-10-19 DIAGNOSIS — M799 Soft tissue disorder, unspecified: Secondary | ICD-10-CM | POA: Diagnosis not present

## 2018-10-19 DIAGNOSIS — M5416 Radiculopathy, lumbar region: Secondary | ICD-10-CM | POA: Diagnosis not present

## 2018-10-19 DIAGNOSIS — H6123 Impacted cerumen, bilateral: Secondary | ICD-10-CM | POA: Diagnosis not present

## 2018-10-21 DIAGNOSIS — R262 Difficulty in walking, not elsewhere classified: Secondary | ICD-10-CM | POA: Diagnosis not present

## 2018-10-21 DIAGNOSIS — M799 Soft tissue disorder, unspecified: Secondary | ICD-10-CM | POA: Diagnosis not present

## 2018-10-21 DIAGNOSIS — M545 Low back pain: Secondary | ICD-10-CM | POA: Diagnosis not present

## 2018-10-21 DIAGNOSIS — M5416 Radiculopathy, lumbar region: Secondary | ICD-10-CM | POA: Diagnosis not present

## 2018-10-24 ENCOUNTER — Other Ambulatory Visit (INDEPENDENT_AMBULATORY_CARE_PROVIDER_SITE_OTHER): Payer: PPO

## 2018-10-24 DIAGNOSIS — E875 Hyperkalemia: Secondary | ICD-10-CM

## 2018-10-24 LAB — COMPREHENSIVE METABOLIC PANEL
ALT: 15 U/L (ref 0–35)
AST: 15 U/L (ref 0–37)
Albumin: 4.1 g/dL (ref 3.5–5.2)
Alkaline Phosphatase: 74 U/L (ref 39–117)
BUN: 25 mg/dL — ABNORMAL HIGH (ref 6–23)
CO2: 32 mEq/L (ref 19–32)
Calcium: 9.5 mg/dL (ref 8.4–10.5)
Chloride: 101 mEq/L (ref 96–112)
Creatinine, Ser: 0.86 mg/dL (ref 0.40–1.20)
GFR: 62.93 mL/min (ref >60.00)
Glucose, Bld: 109 mg/dL — ABNORMAL HIGH (ref 70–99)
Potassium: 4.5 mEq/L (ref 3.5–5.1)
Sodium: 139 mEq/L (ref 135–145)
Total Bilirubin: 0.6 mg/dL (ref 0.2–1.2)
Total Protein: 6.9 g/dL (ref 6.0–8.3)

## 2018-10-26 ENCOUNTER — Encounter: Payer: Self-pay | Admitting: *Deleted

## 2018-10-26 DIAGNOSIS — M799 Soft tissue disorder, unspecified: Secondary | ICD-10-CM | POA: Diagnosis not present

## 2018-10-26 DIAGNOSIS — M545 Low back pain: Secondary | ICD-10-CM | POA: Diagnosis not present

## 2018-10-26 DIAGNOSIS — M5416 Radiculopathy, lumbar region: Secondary | ICD-10-CM | POA: Diagnosis not present

## 2018-10-26 DIAGNOSIS — R262 Difficulty in walking, not elsewhere classified: Secondary | ICD-10-CM | POA: Diagnosis not present

## 2018-10-28 DIAGNOSIS — R262 Difficulty in walking, not elsewhere classified: Secondary | ICD-10-CM | POA: Diagnosis not present

## 2018-10-28 DIAGNOSIS — M5416 Radiculopathy, lumbar region: Secondary | ICD-10-CM | POA: Diagnosis not present

## 2018-10-28 DIAGNOSIS — M545 Low back pain: Secondary | ICD-10-CM | POA: Diagnosis not present

## 2018-10-28 DIAGNOSIS — M799 Soft tissue disorder, unspecified: Secondary | ICD-10-CM | POA: Diagnosis not present

## 2018-10-31 DIAGNOSIS — R262 Difficulty in walking, not elsewhere classified: Secondary | ICD-10-CM | POA: Diagnosis not present

## 2018-10-31 DIAGNOSIS — M5416 Radiculopathy, lumbar region: Secondary | ICD-10-CM | POA: Diagnosis not present

## 2018-10-31 DIAGNOSIS — M799 Soft tissue disorder, unspecified: Secondary | ICD-10-CM | POA: Diagnosis not present

## 2018-10-31 DIAGNOSIS — M545 Low back pain: Secondary | ICD-10-CM | POA: Diagnosis not present

## 2018-11-01 ENCOUNTER — Telehealth: Payer: Self-pay | Admitting: *Deleted

## 2018-11-01 NOTE — Telephone Encounter (Signed)
Received Physician Orders from [place]; forwarded to provider/SLS 03/03

## 2019-01-11 ENCOUNTER — Other Ambulatory Visit: Payer: Self-pay | Admitting: Family Medicine

## 2019-01-11 DIAGNOSIS — I10 Essential (primary) hypertension: Secondary | ICD-10-CM

## 2019-03-03 IMAGING — DX DG LUMBAR SPINE 2-3V
3 series · 3 of 3 positions shown · non-contrast
Comparison: KUB, 12/30/2017.

CLINICAL DATA: Right groin area pain in right hip pain for 3 weeks.
No known injury.

EXAM:
LUMBAR SPINE - 2-3 VIEW

[l-spine ap]
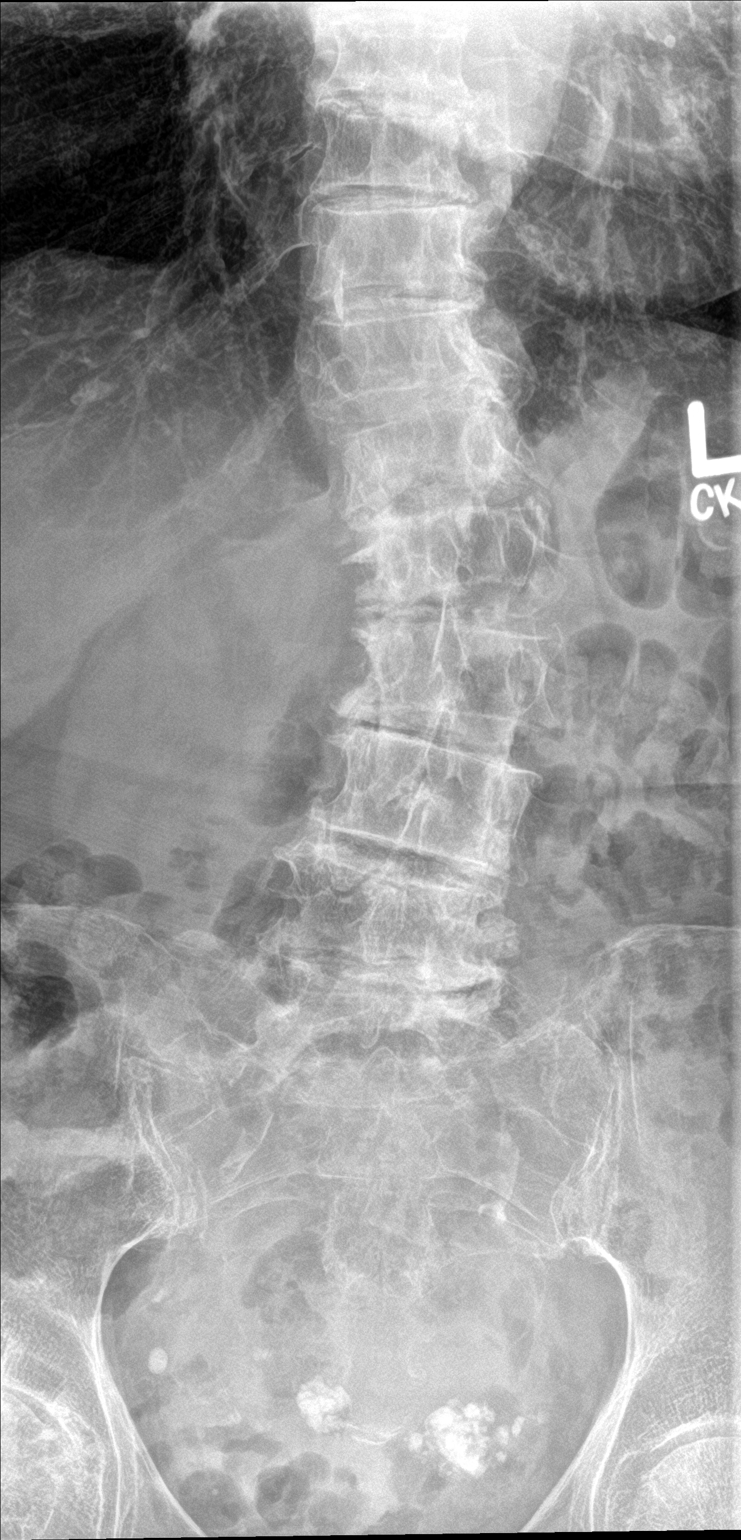

[l-spine lat]
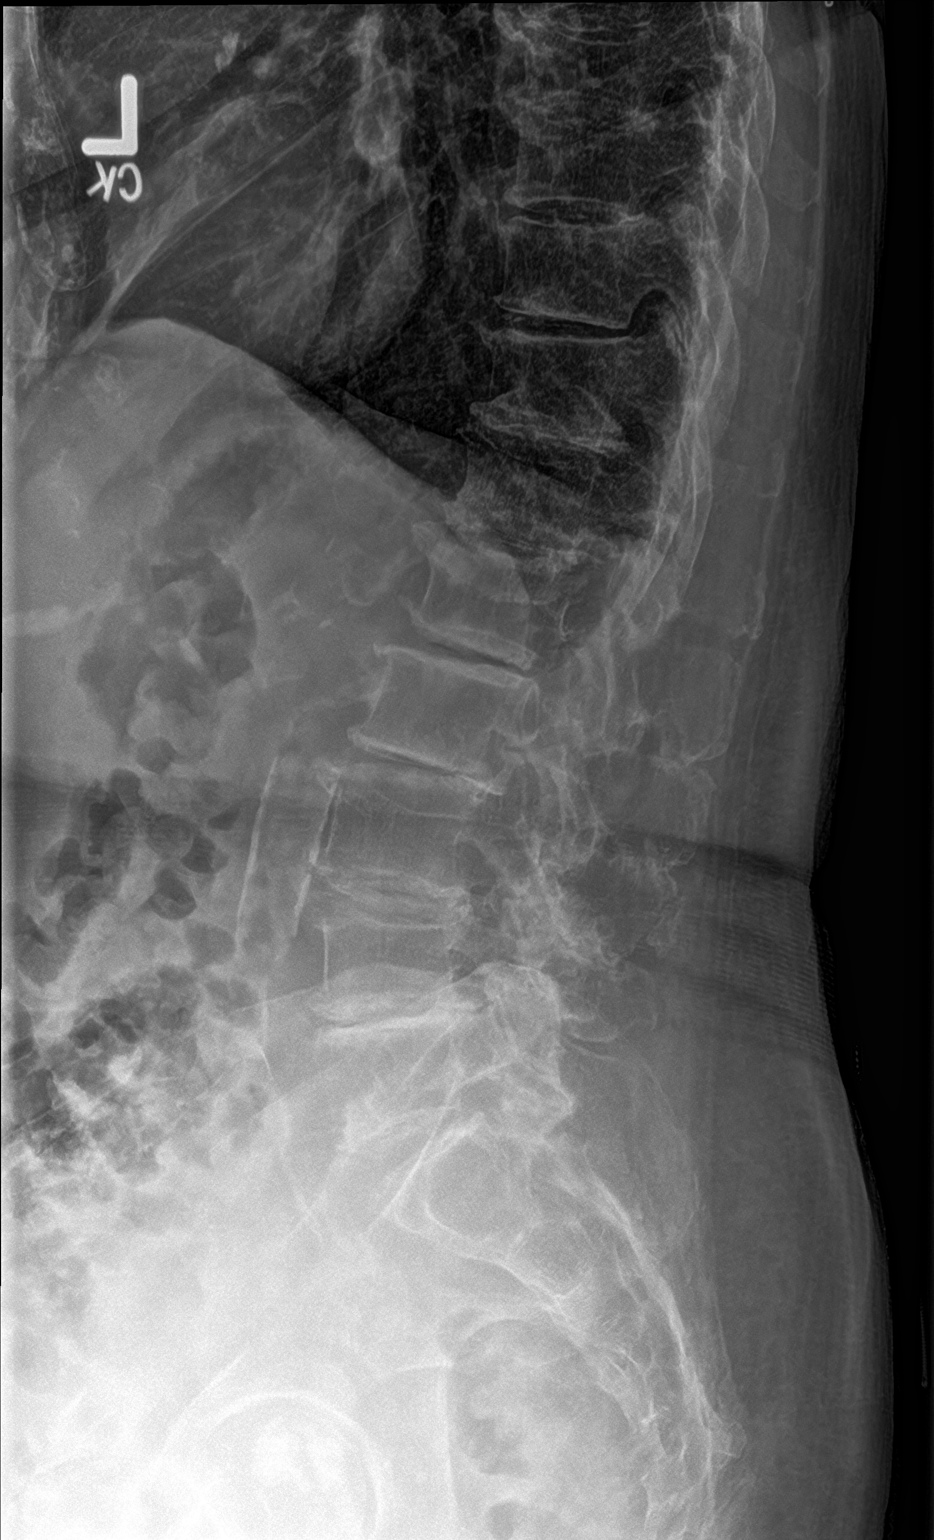

[l-spine spot]
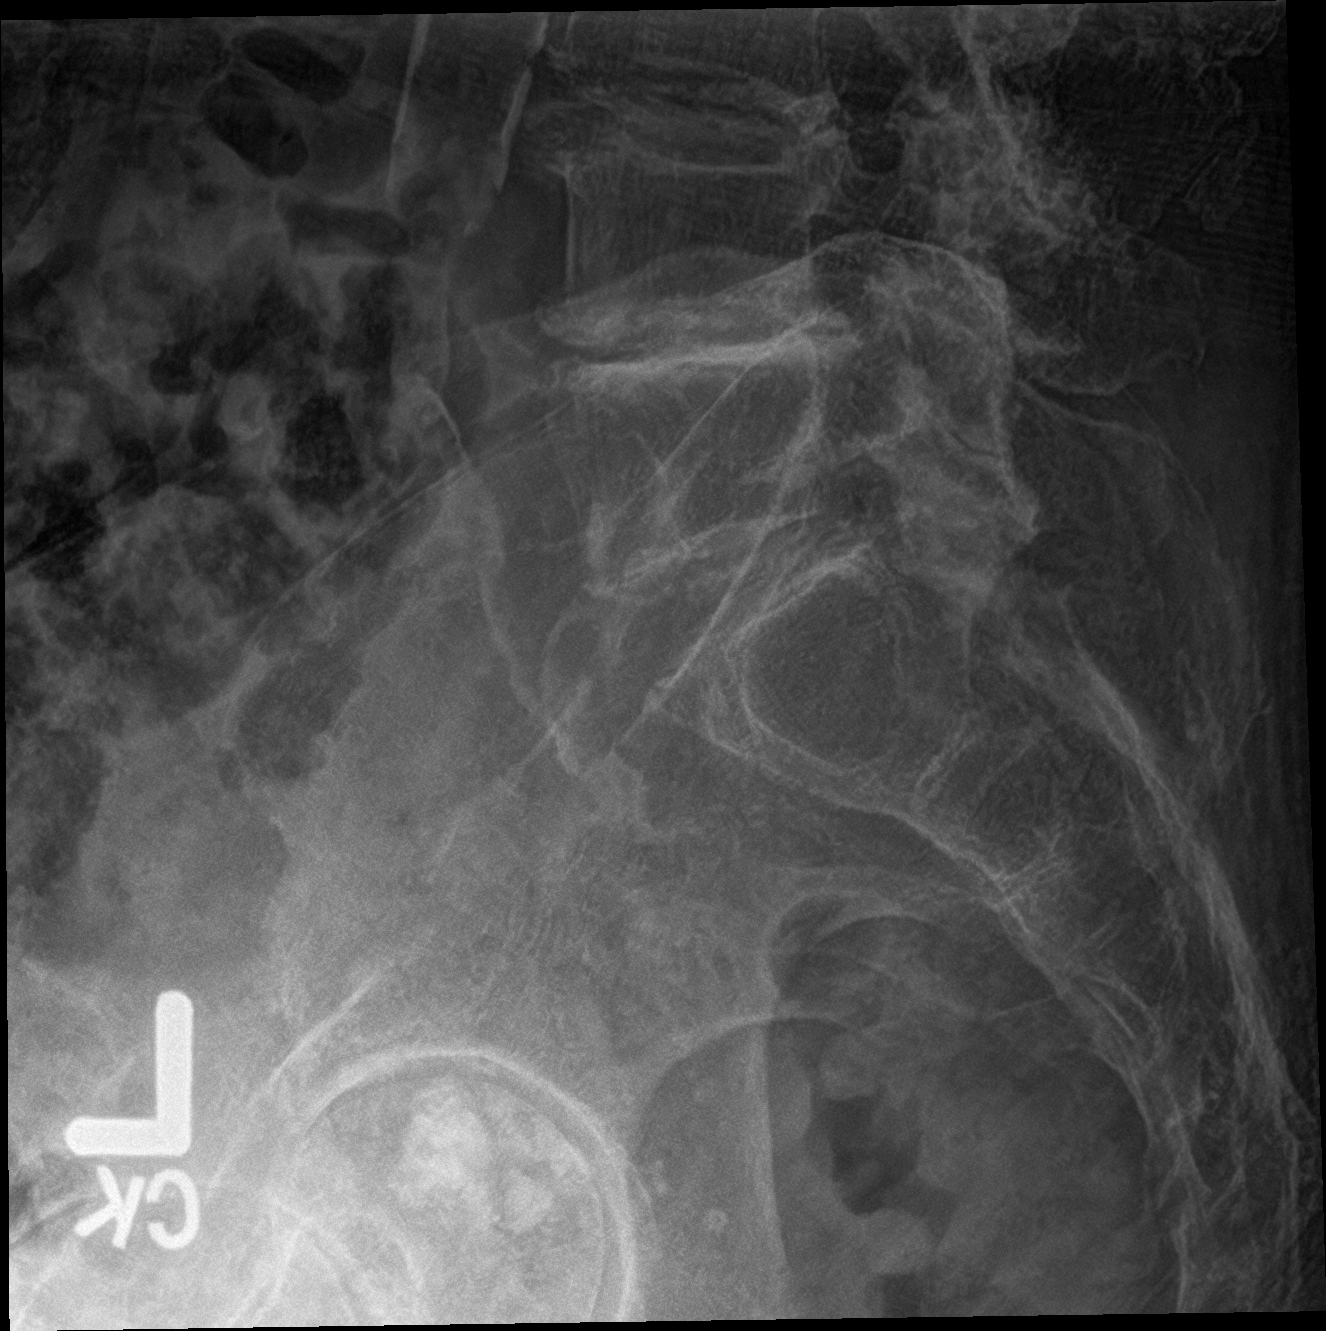

[3 of 3 positions shown; findings below may reference images not displayed]

FINDINGS: No fracture.  No bone lesion.

Grade 1 anterolisthesis of L5 on S1. Minimal retrolisthesis of L2 on
L3.

Mild-to-moderate levoscoliosis, apex at L2-L3.

Moderate to marked loss of disc height at T11-T12 and T12-L1. Mild
loss of disc height at L1-L2. Moderate loss of disc height at
L2-L3-L3-L4. Marked loss of disc height at L4-L5. There are facet
degenerative changes which are most evident at L5-S1.

Bones are diffusely demineralized.

Dense calcification is noted along a normal caliber abdominal aorta.
There are calcifications in the pelvis consistent with calcified
fibroids.
IMPRESSION: 1. No fracture or acute finding.
2. Degenerative changes as detailed above. Levoscoliosis. Diffuse
skeletal demineralization.

## 2019-03-21 ENCOUNTER — Other Ambulatory Visit: Payer: Self-pay

## 2019-03-21 ENCOUNTER — Encounter: Payer: Self-pay | Admitting: Family Medicine

## 2019-03-21 ENCOUNTER — Ambulatory Visit (INDEPENDENT_AMBULATORY_CARE_PROVIDER_SITE_OTHER): Payer: PPO | Admitting: Family Medicine

## 2019-03-21 DIAGNOSIS — I10 Essential (primary) hypertension: Secondary | ICD-10-CM

## 2019-03-21 DIAGNOSIS — R739 Hyperglycemia, unspecified: Secondary | ICD-10-CM

## 2019-03-21 MED ORDER — SPIRONOLACTONE 25 MG PO TABS: 25.0000 mg | ORAL_TABLET | Freq: Every day | ORAL | 3 refills | Status: DC

## 2019-03-21 MED ORDER — LOSARTAN POTASSIUM 100 MG PO TABS: ORAL_TABLET | ORAL | 3 refills | Status: DC

## 2019-03-21 MED ORDER — NADOLOL 20 MG PO TABS: ORAL_TABLET | ORAL | 3 refills | Status: DC

## 2019-03-21 NOTE — Progress Notes (Signed)
Virtual Visit via Telephone Note  I connected with Shannon Austin on 03/21/19 at 11:00 AM EDT by telephone and verified that I am speaking with the correct person using two identifiers.  Location: Patient: home  Provider: office    I discussed the limitations, risks, security and privacy concerns of performing an evaluation and management service by telephone and the availability of in person appointments. I also discussed with the patient that there may be a patient responsible charge related to this service. The patient expressed understanding and agreed to proceed.   History of Present Illness: Unable to get vitals today Pt in NAD    Observations/Objective: No vitals obtained Pt is nad   Assessment and Plan: 1. Essential hypertension Tolerating statin, encouraged heart healthy diet, avoid trans fats, minimize simple carbs and saturated fats. Increase exercise as tolerated - Lipid panel; Future - Comprehensive metabolic panel; Future - spironolactone (ALDACTONE) 25 MG tablet; Take 1 tablet (25 mg total) by mouth daily.  Dispense: 90 tablet; Refill: 3 - nadolol (CORGARD) 20 MG tablet; TAKE 1 TABLET(20 MG) BY MOUTH DAILY  Dispense: 90 tablet; Refill: 3 - losartan (COZAAR) 100 MG tablet; TAKE 1 TABLET(100 MG) BY MOUTH DAILY  Dispense: 90 tablet; Refill: 3  2. Hyperglycemia Watch simple sugars and starches  - Hemoglobin A1c; Future   Follow Up Instructions:    I discussed the assessment and treatment plan with the patient. The patient was provided an opportunity to ask questions and all were answered. The patient agreed with the plan and demonstrated an understanding of the instructions.   The patient was advised to call back or seek an in-person evaluation if the symptoms worsen or if the condition fails to improve as anticipated.  I provided 20 minutes of non-face-to-face time during this encounter.   Ann Held, DO

## 2019-03-22 ENCOUNTER — Telehealth: Payer: Self-pay | Admitting: Family Medicine

## 2019-03-22 NOTE — Telephone Encounter (Signed)
Spoke with pt to schedule her lab appt and her 6 months appt, pt does not want to come to the office during this time of Covid-19. Pt will call to schedule the appt when she feels ready to come.

## 2019-05-02 DIAGNOSIS — H6123 Impacted cerumen, bilateral: Secondary | ICD-10-CM | POA: Diagnosis not present

## 2019-07-26 ENCOUNTER — Other Ambulatory Visit: Payer: Self-pay

## 2019-10-06 ENCOUNTER — Telehealth: Payer: Self-pay | Admitting: Family Medicine

## 2019-10-06 NOTE — Chronic Care Management (AMB) (Signed)
  Chronic Care Management   Note  10/06/2019 Name: Shannon Austin MRN: [id number] DOB: 12-09-1932  Shannon Austin is a 86 y.o. year old female who is a primary care patient of Ann Held, DO. I reached out to Rosalie Gums by phone today in response to a referral sent by [name]'s PCP, Carollee Herter, Alferd Apa, DO.   [name] was given information about Chronic Care Management services today including:  1. CCM service includes personalized support from designated clinical staff supervised by her physician, including individualized plan of care and coordination with other care providers 2. 24/7 contact phone numbers for assistance for urgent and routine care needs. 3. Service will only be billed when office clinical staff spend 20 minutes or more in a month to coordinate care. 4. Only one practitioner may furnish and bill the service in a calendar month. 5. The patient may stop CCM services at any time (effective at the end of the month) by phone call to the office staff. 6. The patient will be responsible for cost sharing (co-pay) of up to 20% of the service fee (after annual deductible is met).  Patient agreed to services and verbal consent obtained.   Follow up plan:   Raynicia Dukes [employer] Scheduler

## 2019-10-09 ENCOUNTER — Ambulatory Visit: Payer: PPO | Admitting: Pharmacist

## 2019-10-09 ENCOUNTER — Other Ambulatory Visit: Payer: Self-pay

## 2019-10-09 DIAGNOSIS — E785 Hyperlipidemia, unspecified: Secondary | ICD-10-CM

## 2019-10-09 DIAGNOSIS — I10 Essential (primary) hypertension: Secondary | ICD-10-CM

## 2019-10-09 DIAGNOSIS — N952 Postmenopausal atrophic vaginitis: Secondary | ICD-10-CM

## 2019-10-09 DIAGNOSIS — H8110 Benign paroxysmal vertigo, unspecified ear: Secondary | ICD-10-CM

## 2019-10-09 DIAGNOSIS — I471 Supraventricular tachycardia: Secondary | ICD-10-CM

## 2019-10-09 NOTE — Chronic Care Management (AMB) (Signed)
Chronic Care Management Pharmacy  Name: Shannon Austin  MRN: [id number] DOB: 1932/10/10   Chief Complaint/ HPI  Shannon Austin,  86 y.o. , female presents for their Initial CCM visit with the clinical pharmacist via telephone.  PCP : Ann Held, DO  Their chronic conditions include: HTN, SVT, BPPV, Osteoporosis, HLD, Atrophic vaginitis, Allergic rhinitis, Overactive Bladder  Office Visits: 03/22/19: Pt noted that she is not comfortable with coming in for lab/office visits due to pandemic 03/21/19: Telephone visit w/ Dr. Etter Sjogren: Refills and labs ordered (lipid, CMP, a1c)  Consult Visit: 05/02/2019: ENT w/ Dr. Laurance Flatten: Bilateral impacted cerumen removal. Pt tolerated well. F/U in 6 months for subsequent cerumen removal.   Medications: Outpatient Encounter Medications as of 10/09/2019  Medication Sig Note  . ESTRACE VAGINAL 0.1 MG/GM vaginal cream Place 1 Applicatorful vaginally 3 (three) times a week.  04/25/2014: Received from: External Pharmacy  . fish oil-omega-3 fatty acids 1000 MG capsule Take 1,200 mg by mouth daily.    Marland Kitchen ipratropium (ATROVENT) 0.06 % nasal spray Place 2 sprays into both nostrils 4 (four) times daily as needed for rhinitis. 10/09/2019: Taking once daily  . losartan (COZAAR) 100 MG tablet TAKE 1 TABLET(100 MG) BY MOUTH DAILY   . Multiple Vitamins-Minerals (PRESERVISION AREDS PO) Take 1 tablet by mouth daily.   Marland Kitchen MYRBETRIQ 50 MG TB24 tablet Take 1 tablet by mouth daily.   . nadolol (CORGARD) 20 MG tablet TAKE 1 TABLET(20 MG) BY MOUTH DAILY   . spironolactone (ALDACTONE) 25 MG tablet Take 1 tablet (25 mg total) by mouth daily.    No facility-administered encounter medications on file as of 10/09/2019.     Current Diagnosis/Assessment:  Goals Addressed            This Visit's Progress   . [place] Phone number: [phone] Registration opens 10/10/2019 8am-5pm    . Pharmacy Care Plan       Current Barriers:   . Chronic Disease Management support, education, and care coordination needs related to HTN, SVT, BPPV, Osteoporosis, HLD, Atrophic vaginitis, Allergic rhinitis, Overactive Bladder  Pharmacist Clinical Goal(s):  . Pt to get COVID Vaccine  Interventions: . Comprehensive medication review performed. . Provided patient information about COVID Vaccine  Patient Self Care Activities:  . Patient verbalizes understanding of plan to follow guidance as above, Self administers medications as prescribed, Calls pharmacy for medication refills, and Calls provider office for new concerns or questions  Initial goal documentation        Social Hx: Going to [facility]. Using pill box and fills them up 2 weeks at a time  Very interested in getting COVID vaccine. Is having a hard time navigating how to get one.   Hypertension/SVT   BP today is:  Unable to assess due phone visit  CMP     Component Value Date/Time   NA 139 10/24/2018 1029   K 4.5 10/24/2018 1029   CL 101 10/24/2018 1029   CO2 32 10/24/2018 1029   GLUCOSE 109 (H) 10/24/2018 1029   BUN 25 (H) 10/24/2018 1029   CREATININE 0.86 10/24/2018 1029   CALCIUM 9.5 10/24/2018 1029   PROT 6.9 10/24/2018 1029   ALBUMIN 4.1 10/24/2018 1029   AST 15 10/24/2018 1029   ALT 15 10/24/2018 1029   ALKPHOS 74 10/24/2018 1029   BILITOT 0.6 10/24/2018 1029   GFRNONAA 107.71 04/02/2010 1008   GFRAA 156 10/12/2008 1118  Office blood pressures are  BP Readings from Last 3 Encounters:  10/13/18 128/76  09/19/18 106/70  01/06/18 128/88    Patient has failed these meds in the past: lisinopril (cough), hctz (muscle spasm)  Patient is currently controlled on the following medications: losartan 100mg  daily, spironolactone 25mg  daily, nadolol 20mg  daily  Patient checks BP at home infrequently. Pt does not have home BP cuff.  Patient home BP readings are ranging: N/A (128/70 checked with her brother's BP cuff about 4 months ago)  We  discussed getting a blood pressure cuff to monitor home Bps  Gross hematuria over 10 years ago where she had SVT and prescribed and stayed on nadalol   Denies weakness, dizziness, or palpitations  Plan  Continue current medications      Osteoporosis   04/15/2015 - DEXA osteoporosis L femoral Tscore = -2.9 Patient has failed these meds in past: fosamax (stopped taking due to lifestyle; not tolerating being NPO in order to take med) Patient is currently uncontrolled on the following medications: No medications. Not interested in treatment.    Plan  Continue fall prevention strategies   Hyperlipidemia   Lipid Panel     Component Value Date/Time   CHOL 205 (H) 09/19/2018 1447   TRIG 91.0 09/19/2018 1447   HDL 64.10 09/19/2018 1447   CHOLHDL 3 09/19/2018 1447   VLDL 18.2 09/19/2018 1447   LDLCALC 123 (H) 09/19/2018 1447   LDLDIRECT 125.8 05/03/2013 1037   Patient has failed these meds in past: statin intolerance listed in allergies Patient is currently uncontrolled for total cholesterol and LDL on the following medications: fish oil 1200mg  daily Due to pt's age and noted statin intolerance will not recommend treatment.  Plan  Continue control with diet and exercise   Atrophic vaginitis    Patient has failed these meds in past: None noted Patient is currently controlled on the following medications: estrace vaginal 0.1mg /gm three times a week   Plan  Continue current medications   Overactive Bladder    Patient has failed these meds in past: None noted Patient is currently controlled on the following medications: myrbetriq 50mg  once daily  Plan  Continue current medications   Allergic Rhinitis    Patient has failed these meds in past: None noted Patient is currently controlled on the following medications: ipratropium 0.06% nasal spray 2 sprays each nostril once daily prn (prescribed for 4 times daily)  Plan  Continue current medications   COVID  Vaccine Interest   Gave patient information about COVID Vaccine.   Phone number: [phone] Registration opens 10/10/2019 8am-5pm Patient states she has a history of Bell's Palsy and is concerned about receiving COVID Vaccine.  Reported information from [facility] Regarding Bell's Palsy and COVID Palsy.  Cases of Bell's palsy were reported in participants in the mRNA COVID-19 vaccine clinical trials. However, the Food and Drug Administration (FDA) does not consider these to be above the rate expected in the general population. They have not concluded these cases were caused by vaccination. Therefore, persons who have previously had Bell's Palsy may receive an mRNA COVID-19 vaccine.  Plan -Get COVID Vaccine

## 2019-10-12 ENCOUNTER — Other Ambulatory Visit: Payer: Self-pay

## 2019-10-12 DIAGNOSIS — I10 Essential (primary) hypertension: Secondary | ICD-10-CM

## 2019-10-12 DIAGNOSIS — E785 Hyperlipidemia, unspecified: Secondary | ICD-10-CM

## 2019-10-12 NOTE — Patient Instructions (Signed)
Visit Information  Goals Addressed            This Visit's Progress   . [place] Phone number: [phone] Registration opens 10/10/2019 8am-5pm    . Pharmacy Care Plan       Current Barriers:  . Chronic Disease Management support, education, and care coordination needs related to HTN, SVT, BPPV, Osteoporosis, HLD, Atrophic vaginitis, Allergic rhinitis, Overactive Bladder  Pharmacist Clinical Goal(s):  . Pt to get COVID Vaccine  Interventions: . Comprehensive medication review performed. . Provided patient information about COVID Vaccine  Patient Self Care Activities:  . Patient verbalizes understanding of plan to follow guidance as above, Self administers medications as prescribed, Calls pharmacy for medication refills, and Calls provider office for new concerns or questions  Initial goal documentation        Ms. Dorfman was given information about Chronic Care Management services today including:  1. CCM service includes personalized support from designated clinical staff supervised by her physician, including individualized plan of care and coordination with other care providers 2. 24/7 contact phone numbers for assistance for urgent and routine care needs. 3. Service will only be billed when office clinical staff spend 20 minutes or more in a month to coordinate care. 4. Only one practitioner may furnish and bill the service in a calendar month. 5. The patient may stop CCM services at any time (effective at the end of the month) by phone call to the office staff. 6. The patient will be responsible for cost sharing (co-pay) of up to 20% of the service fee (after annual deductible is met).  Patient agreed to services and verbal consent obtained.   Print copy of patient instructions provided.  The pharmacy team will reach out to the patient again over the next 90 days.   De [place]anch, PharmD Clinical Pharmacist [facility] at  [facility] [phone]   COVID-19 Frequently Asked Questions COVID-19 (coronavirus disease) is an infection that is caused by a large family of viruses. Some viruses cause illness in people and others cause illness in animals like camels, cats, and bats. In some cases, the viruses that cause illness in animals can spread to humans. Where did the coronavirus come from? In December 2019, [place] told the [facility]) of several cases of lung disease (human respiratory illness). These cases were linked to an open seafood and livestock market in the city of [place]. The link to the seafood and livestock market suggests that the virus may have spread from animals to humans. However, since that first outbreak in December, the virus has also been shown to spread from person to person. What is the name of the disease and the virus? Disease name Early on, this disease was called novel coronavirus. This is because scientists determined that the disease was caused by a new (novel) respiratory virus. The World Health Organization [facility]) has now named the disease COVID-19, or coronavirus disease. Virus name The virus that causes the disease is called severe acute respiratory syndrome coronavirus 2 (SARS-CoV-2). More information on disease and virus naming World Health Organization [facility]): www.who.int/emergencies/diseases/novel-coronavirus-2019/technical-guidance/naming-the-coronavirus-disease-(covid-2019)-and-the-virus-that-causes-it Who is at risk for complications from coronavirus disease? Some people may be at higher risk for complications from coronavirus disease. This includes older adults and people who have chronic diseases, such as heart disease, diabetes, and lung disease. If you are at higher risk for complications, take these extra precautions:  Stay home as much  as possible.  Avoid social gatherings and travel.  Avoid close contact with others. Stay at least 6 ft (2 m)  away from others, if possible.  Wash your hands often with soap and water for at least 20 seconds.  Avoid touching your face, mouth, nose, or eyes.  Keep supplies on hand at home, such as food, medicine, and cleaning supplies.  If you must go out in public, wear a cloth face covering or face mask. Make sure your mask covers your nose and mouth. How does coronavirus disease spread? The virus that causes coronavirus disease spreads easily from person to person (is contagious). You may catch the virus by:  Breathing in droplets from an infected person. Droplets can be spread by a person breathing, speaking, singing, coughing, or sneezing.  Touching something, like a table or a doorknob, that was exposed to the virus (contaminated) and then touching your mouth, nose, or eyes. Can I get the virus from touching surfaces or objects? There is still a lot that we do not know about the virus that causes coronavirus disease. Scientists are basing a lot of information on what they know about similar viruses, such as:  Viruses cannot generally survive on surfaces for long. They need a human body (host) to survive.  It is more likely that the virus is spread by close contact with people who are sick (direct contact), such as through: ? Shaking hands or hugging. ? Breathing in respiratory droplets that travel through the air. Droplets can be spread by a person breathing, speaking, singing, coughing, or sneezing.  It is less likely that the virus is spread when a person touches a surface or object that has the virus on it (indirect contact). The virus may be able to enter the body if the person touches a surface or object and then touches his or her face, eyes, nose, or mouth. Can a person spread the virus without having symptoms of the disease? It may be possible for the virus to spread before a person has symptoms of the disease, but this is most likely not the main way the virus is spreading. It is more  likely for the virus to spread by being in close contact with people who are sick and breathing in the respiratory droplets spread by a person breathing, speaking, singing, coughing, or sneezing. What are the symptoms of coronavirus disease? Symptoms vary from person to person and can range from mild to severe. Symptoms may include:  Fever or chills.  Cough.  Difficulty breathing or feeling short of breath.  Headaches, body aches, or muscle aches.  Runny or stuffy (congested) nose.  Sore throat.  New loss of taste or smell.  Nausea, vomiting, or diarrhea. These symptoms can appear anywhere from 2 to 14 days after you have been exposed to the virus. Some people may not have any symptoms. If you develop symptoms, call your health care provider. People with severe symptoms may need hospital care. Should I be tested for this virus? Your health care provider will decide whether to test you based on your symptoms, history of exposure, and your risk factors. How does a health care provider test for this virus? Health care providers will collect samples to send for testing. Samples may include:  Taking a swab of fluid from the back of your nose and throat, your nose, or your throat.  Taking fluid from the lungs by having you cough up mucus (sputum) into a sterile cup.  Taking a blood  sample. Is there a treatment or vaccine for this virus? Currently, there is no vaccine to prevent coronavirus disease. Also, there are no medicines like antibiotics or antivirals to treat the virus. A person who becomes sick is given supportive care, which means rest and fluids. A person may also relieve his or her symptoms by using over-the-counter medicines that treat sneezing, coughing, and runny nose. These are the same medicines that a person takes for the common cold. If you develop symptoms, call your health care provider. People with severe symptoms may need hospital care. What can I do to protect myself  and my family from this virus?     You can protect yourself and your family by taking the same actions that you would take to prevent the spread of other viruses. Take the following actions:  Wash your hands often with soap and water for at least 20 seconds. If soap and water are not available, use alcohol-based hand sanitizer.  Avoid touching your face, mouth, nose, or eyes.  Cough or sneeze into a tissue, sleeve, or elbow. Do not cough or sneeze into your hand or the air. ? If you cough or sneeze into a tissue, throw it away immediately and wash your hands.  Disinfect objects and surfaces that you frequently touch every [place].  Stay away from people who are sick.  Avoid going out in public, follow guidance from your state and local health authorities.  Avoid crowded indoor spaces. Stay at least 6 ft (2 m) away from others.  If you must go out in public, wear a cloth face covering or face mask. Make sure your mask covers your nose and mouth.  Stay home if you are sick, except to get medical care. Call your health care provider before you get medical care. Your health care provider will tell you how long to stay home.  Make sure your vaccines are up to date. Ask your health care provider what vaccines you need. What should I do if I need to travel? Follow travel recommendations from your local health authority, the CDC, and WHO. Travel information and advice  Centers for Disease Control and Prevention (CDC): BodyEditor.hu  World Health Organization [facility]): ThirdIncome.ca Know the risks and take action to protect your health  You are at higher risk of getting coronavirus disease if you are traveling to areas with an outbreak or if you are exposed to travelers from areas with an outbreak.  Wash your hands often and practice good hygiene to lower the risk of catching or spreading the  virus. What should I do if I am sick? General instructions to stop the spread of infection  Wash your hands often with soap and water for at least 20 seconds. If soap and water are not available, use alcohol-based hand sanitizer.  Cough or sneeze into a tissue, sleeve, or elbow. Do not cough or sneeze into your hand or the air.  If you cough or sneeze into a tissue, throw it away immediately and wash your hands.  Stay home unless you must get medical care. Call your health care provider or local health authority before you get medical care.  Avoid public areas. Do not take public transportation, if possible.  If you can, wear a mask if you must go out of the house or if you are in close contact with someone who is not sick. Make sure your mask covers your nose and mouth. Keep your home clean  Disinfect objects and surfaces that  are frequently touched every Markanthony Gedney. This may include: ? Counters and tables. ? Doorknobs and light switches. ? Sinks and faucets. ? Electronics such as phones, remote controls, keyboards, computers, and tablets.  Wash dishes in hot, soapy water or use a dishwasher. Air-dry your dishes.  Wash laundry in hot water. Prevent infecting other household members  Let healthy household members care for children and pets, if possible. If you have to care for children or pets, wash your hands often and wear a mask.  Sleep in a different bedroom or bed, if possible.  Do not share personal items, such as razors, toothbrushes, deodorant, combs, brushes, towels, and washcloths. Where to find more information Centers for Disease Control and Prevention (CDC)  Information and news updates: https://www.butler-gonzalez.com/ World Health Organization [facility])  Information and news updates: MissExecutive.com.ee  Coronavirus health topic: https://www.castaneda.info/  Questions and answers on COVID-19:  OpportunityDebt.at  Global tracker: who.sprinklr.com American Academy of Pediatrics (AAP)  Information for families: www.healthychildren.org/English/health-issues/conditions/chest-lungs/Pages/2019-Novel-Coronavirus.aspx The coronavirus situation is changing rapidly. Check your local health authority website or the CDC and [facility] websites for updates and news. When should I contact a health care provider?  Contact your health care provider if you have symptoms of an infection, such as fever or cough, and you: ? Have been near anyone who is known to have coronavirus disease. ? Have come into contact with a person who is suspected to have coronavirus disease. ? Have traveled to an area where there is an outbreak of COVID-19. When should I get emergency medical care?  Get help right away by calling your local emergency services (911 in the [place] if you have: ? Trouble breathing. ? Pain or pressure in your chest. ? Confusion. ? Blue-tinged lips and fingernails. ? Difficulty waking from sleep. ? Symptoms that get worse. Let the emergency medical personnel know if you think you have coronavirus disease. Summary  A new respiratory virus is spreading from person to person and causing COVID-19 (coronavirus disease).  The virus that causes COVID-19 appears to spread easily. It spreads from one person to another through droplets from breathing, speaking, singing, coughing, or sneezing.  Older adults and those with chronic diseases are at higher risk of disease. If you are at higher risk for complications, take extra precautions.  There is currently no vaccine to prevent coronavirus disease. There are no medicines, such as antibiotics or antivirals, to treat the virus.  You can protect yourself and your family by washing your hands often, avoiding touching your face, and covering your coughs and sneezes. This information is not intended to replace advice given to you  by your health care provider. Make sure you discuss any questions you have with your health care provider. Document Revised: 06/16/2019 Document Reviewed: 12/13/2018 Elsevier Patient Education  [place].

## 2019-10-31 ENCOUNTER — Other Ambulatory Visit: Payer: Self-pay | Admitting: *Deleted

## 2019-10-31 DIAGNOSIS — I10 Essential (primary) hypertension: Secondary | ICD-10-CM

## 2019-10-31 MED ORDER — NADOLOL 20 MG PO TABS: ORAL_TABLET | ORAL | 0 refills | Status: DC

## 2019-11-14 ENCOUNTER — Telehealth: Payer: Self-pay | Admitting: Family Medicine

## 2019-11-14 DIAGNOSIS — I10 Essential (primary) hypertension: Secondary | ICD-10-CM

## 2019-11-14 MED ORDER — SPIRONOLACTONE 25 MG PO TABS: 25.0000 mg | ORAL_TABLET | Freq: Every day | ORAL | 0 refills | Status: DC

## 2019-11-14 MED ORDER — LOSARTAN POTASSIUM 100 MG PO TABS: ORAL_TABLET | ORAL | 0 refills | Status: DC

## 2019-11-14 NOTE — Telephone Encounter (Signed)
Medication:spironolactone (ALDACTONE) 25 MG tablet [id number]   losartan (COZAAR) 100 MG tablet [id number]     Has the patient contacted their pharmacy? Yes.   (If no, request that the patient contact the pharmacy for the refill.) (If yes, when and what did the pharmacy advise?)  Preferred Pharmacy (with phone number or street name): [facility] [place], [address]  [place], [place] [address]  Phone:  [phone] Fax:  [phone]  DEA #:  [id number]  Agent: Please be advised that RX refills may take up to 3 business days. We ask that you follow-up with your pharmacy.

## 2019-11-14 NOTE — Telephone Encounter (Signed)
Med refilled.

## 2019-11-15 ENCOUNTER — Telehealth: Payer: Self-pay

## 2019-11-15 DIAGNOSIS — I10 Essential (primary) hypertension: Secondary | ICD-10-CM

## 2019-11-15 NOTE — Telephone Encounter (Signed)
Advised pt that 30 day supply was because she needed OV for greater quantities / refills. Pt has virtual visit tomorrow with PCP and would like 90 day supply sent at that time.

## 2019-11-15 NOTE — Telephone Encounter (Signed)
Patient called in stating that she needs a 90 day supply instead of a 30 day supply for : losartan (COZAAR) 100 MG tablet [id number]   And for : spironolactone (ALDACTONE) 25 MG tablet [id number]    Please send it to:  [facility] [place], [address]  [place], [place] [address]  Phone:  [phone] Fax:  [phone]  DEA #:  [id number]

## 2019-11-16 ENCOUNTER — Ambulatory Visit (INDEPENDENT_AMBULATORY_CARE_PROVIDER_SITE_OTHER): Payer: PPO | Admitting: Family Medicine

## 2019-11-16 ENCOUNTER — Encounter: Payer: Self-pay | Admitting: Family Medicine

## 2019-11-16 ENCOUNTER — Other Ambulatory Visit: Payer: Self-pay

## 2019-11-16 DIAGNOSIS — I10 Essential (primary) hypertension: Secondary | ICD-10-CM | POA: Diagnosis not present

## 2019-11-16 DIAGNOSIS — R739 Hyperglycemia, unspecified: Secondary | ICD-10-CM | POA: Diagnosis not present

## 2019-11-16 MED ORDER — SPIRONOLACTONE 25 MG PO TABS: 25.0000 mg | ORAL_TABLET | Freq: Every day | ORAL | 1 refills | Status: DC

## 2019-11-16 MED ORDER — LOSARTAN POTASSIUM 100 MG PO TABS: ORAL_TABLET | ORAL | 1 refills | Status: DC

## 2019-11-16 MED ORDER — NADOLOL 20 MG PO TABS: ORAL_TABLET | ORAL | 0 refills | Status: DC

## 2019-11-16 NOTE — Progress Notes (Signed)
Virtual Visit via telephone Note  I connected with Shannon Austin on 11/16/19 at  3:00 PM EDT by a video enabled telemedicine application and verified that I am speaking with the correct person using two identifiers.  Location: Patient: home alone  Provider: office    I discussed the limitations of evaluation and management by telemedicine and the availability of in person appointments. The patient expressed understanding and agreed to proceed.  History of Present Illness: Pt is home needing refills and has no complaints No sob, no cp    Observations/Objective: p 68 wt 118 Pt is in nad  No sob and able to answer questions appropriately  Assessment and Plan: 1. Essential hypertension Pt has not been able to check bp She wants to wait to come into the office until she gets her covid vaccine  - spironolactone (ALDACTONE) 25 MG tablet; Take 1 tablet (25 mg total) by mouth daily.  Dispense: 90 tablet; Refill: 1 - nadolol (CORGARD) 20 MG tablet; TAKE 1 TABLET(20 MG) BY MOUTH DAILY.  Dispense: 90 tablet; Refill: 0 - losartan (COZAAR) 100 MG tablet; TAKE 1 TABLET(100 MG) BY MOUTH DAILY.  Dispense: 90 tablet; Refill: 1 - Comprehensive metabolic panel - Lipid panel - Hemoglobin A1c  2. Hyperglycemia Check labs when she can get in - Comprehensive metabolic panel - Hemoglobin A1c  Follow Up Instructions:    I discussed the assessment and treatment plan with the patient. The patient was provided an opportunity to ask questions and all were answered. The patient agreed with the plan and demonstrated an understanding of the instructions.   The patient was advised to call back or seek an in-person evaluation if the symptoms worsen or if the condition fails to improve as anticipated.  I provided 30 minutes of non-face-to-face time during this encounter.   Ann Held, DO

## 2019-11-16 NOTE — Progress Notes (Deleted)
Patient ID: Shannon Austin, female    DOB: February 11, 1933  Age: 86 y.o. MRN: [id number]    Subjective:  Subjective  HPI SWAYZEE HOWE presents for ***  Review of Systems  History Past Medical History:  Diagnosis Date  . Gross hematuria   . Hyperlipidemia   . Hypertension   . Osteoporosis   . Supraventricular tachycardia ([place])   . Vaginitis    atropic    She has a past surgical history that includes Cesarean section; Dilation and curettage of uterus; fibrocystic mass; Appendectomy; Cataract extraction; Carpal tunnel release; and Cystoscopy (05/09/10, 09/11/10, 11/21/12, 01/01/15).   Her family history includes Diabetes in her maternal grandmother; Hypertension in her father and mother.She reports that she has quit smoking. She has never used smokeless tobacco. She reports current alcohol use. She reports that she does not use drugs.  Current Outpatient Medications on File Prior to Visit  Medication Sig Dispense Refill  . ESTRACE VAGINAL 0.1 MG/GM vaginal cream Place 1 Applicatorful vaginally 3 (three) times a week.     . fish oil-omega-3 fatty acids 1000 MG capsule Take 1,200 mg by mouth daily.     Marland Kitchen ipratropium (ATROVENT) 0.06 % nasal spray Place 2 sprays into both nostrils 4 (four) times daily as needed for rhinitis.    Marland Kitchen losartan (COZAAR) 100 MG tablet TAKE 1 TABLET(100 MG) BY MOUTH DAILY. Pt needs OV for more refills 30 tablet 0  . Multiple Vitamins-Minerals (PRESERVISION AREDS PO) Take 1 tablet by mouth daily.    Marland Kitchen MYRBETRIQ 50 MG TB24 tablet Take 1 tablet by mouth daily.  5  . nadolol (CORGARD) 20 MG tablet TAKE 1 TABLET(20 MG) BY MOUTH DAILY.  NEEDS OV/ FOLLOW UP 90 tablet 0  . spironolactone (ALDACTONE) 25 MG tablet Take 1 tablet (25 mg total) by mouth daily. Pt needs OV for more refills 30 tablet 0   No current facility-administered medications on file prior to visit.     Objective:  Objective  Physical Exam There were no vitals taken for this visit. Wt Readings from Last 3  Encounters:  10/13/18 129 lb 3.2 oz (58.6 kg)  09/19/18 127 lb 9.6 oz (57.9 kg)  01/06/18 121 lb 6.4 oz (55.1 kg)     Lab Results  Component Value Date   WBC 8.2 09/19/2018   HGB 14.4 09/19/2018   HCT 43.0 09/19/2018   PLT 247.0 09/19/2018   GLUCOSE 109 (H) 10/24/2018   CHOL 205 (H) 09/19/2018   TRIG 91.0 09/19/2018   HDL 64.10 09/19/2018   LDLDIRECT 125.8 05/03/2013   LDLCALC 123 (H) 09/19/2018   ALT 15 10/24/2018   AST 15 10/24/2018   NA 139 10/24/2018   K 4.5 10/24/2018   CL 101 10/24/2018   CREATININE 0.86 10/24/2018   BUN 25 (H) 10/24/2018   CO2 32 10/24/2018   TSH 1.57 10/31/2014   INR 1.0 07/19/2008   HGBA1C 5.5 01/06/2018    DG Lumbar Spine 2-3 Views  Result Date: 10/13/2018 CLINICAL DATA:  Right groin area pain in right hip pain for 3 weeks. No known injury. EXAM: LUMBAR SPINE - 2-3 VIEW COMPARISON:  KUB, 12/30/2017. FINDINGS: No fracture.  No bone lesion. Grade 1 anterolisthesis of L5 on S1. Minimal retrolisthesis of L2 on L3. Mild-to-moderate levoscoliosis, apex at L2-L3. Moderate to marked loss of disc height at T11-T12 and T12-L1. Mild loss of disc height at L1-L2. Moderate loss of disc height at L2-L3-L3-L4. Marked loss of disc height at L4-L5. There are  facet degenerative changes which are most evident at L5-S1. Bones are diffusely demineralized. Dense calcification is noted along a normal caliber abdominal aorta. There are calcifications in the pelvis consistent with calcified fibroids. IMPRESSION: 1. No fracture or acute finding. 2. Degenerative changes as detailed above. Levoscoliosis. Diffuse skeletal demineralization. Electronically Signed   By: Lajean Manes M.D.   On: 10/13/2018 16:45   DG Hip Unilat W OR W/O Pelvis 2-3 Views Right  Result Date: 10/13/2018 CLINICAL DATA:  Right hip and groin pain for 3 weeks. EXAM: DG HIP (WITH OR WITHOUT PELVIS) 2-3V RIGHT COMPARISON:  Abdominal radiograph 12/30/2017 FINDINGS: Severe right hip osteoarthrosis is again seen  with complete joint space loss superiorly, subchondral sclerosis, and subchondral cyst formation. Only mild degenerative changes are present at the left hip. No acute fracture or dislocation is identified. Lumbar disc degeneration is reported separately. Coarse calcifications in the pelvis correspond to uterine fibroids. IMPRESSION: Severe right hip osteoarthrosis without acute osseous abnormality. Electronically Signed   By: Logan Bores M.D.   On: 10/13/2018 16:46     Assessment & Plan:  Plan  I am having Wallie Char. Olver maintain her fish oil-omega-3 fatty acids, ESTRACE VAGINAL, Myrbetriq, Multiple Vitamins-Minerals (PRESERVISION AREDS PO), ipratropium, nadolol, spironolactone, and losartan.  No orders of the defined types were placed in this encounter.   Problem List Items Addressed This Visit    None    Visit Diagnoses    Essential hypertension          Follow-up: No follow-ups on file.  Ann Held, DO

## 2020-02-07 ENCOUNTER — Other Ambulatory Visit: Payer: Self-pay

## 2020-02-07 DIAGNOSIS — I10 Essential (primary) hypertension: Secondary | ICD-10-CM

## 2020-02-07 MED ORDER — NADOLOL 20 MG PO TABS: ORAL_TABLET | ORAL | 0 refills | Status: DC

## 2020-04-04 ENCOUNTER — Telehealth: Payer: Self-pay | Admitting: Pharmacist

## 2020-04-04 NOTE — Progress Notes (Addendum)
Chronic Care Management Pharmacy Assistant   Name: Shannon Austin  MRN: [id number] DOB: [birth year]  Reason for Encounter: Disease State  Patient Questions:  1.  Have you seen any other providers since your last visit? No  2.  Any changes in your medicines or health? No    PCP : Ann Held, DO   The patient's chronic conditions include: HTN, SVT, BPPV, Osteoporosis, HLD, Atrophic vaginitis, Allergic rhinitis, Overactive Bladder.  Office Visits: 11-16-2019 (PCP): Patient presented via telemedicine application with Dr. Carollee Herter for an assessment of essential hypertension. Patient stated she has not been able to check her BP at home. There were no significant findings and no medication changes.  02-07-2020; Orders were placed by Dr. Johnnye Sima for Nadolol 20mg ; one tab daily. Patient will need an OV for further refills.  Consult Visits:  There were no consult visits documented since the last CPP visit   Allergies:   Allergies  Allergen Reactions  . Amlodipine Besylate   . Ezetimibe   . Hydrochlorothiazide   . Latex   . Lisinopril Cough  . Statins     Muscle weakness  . Sulfonamide Derivatives Rash and Other (See Comments)    HA    Medications: Outpatient Encounter Medications as of 04/04/2020  Medication Sig Note  . ESTRACE VAGINAL 0.1 MG/GM vaginal cream Place 1 Applicatorful vaginally 3 (three) times a week.  04/25/2014: Received from: External Pharmacy  . fish oil-omega-3 fatty acids 1000 MG capsule Take 1,200 mg by mouth daily.    Marland Kitchen ipratropium (ATROVENT) 0.06 % nasal spray Place 2 sprays into both nostrils 4 (four) times daily as needed for rhinitis. 10/09/2019: Taking once daily  . losartan (COZAAR) 100 MG tablet TAKE 1 TABLET(100 MG) BY MOUTH DAILY.   . Multiple Vitamins-Minerals (PRESERVISION AREDS PO) Take 1 tablet by mouth daily.   Marland Kitchen MYRBETRIQ 50 MG TB24 tablet Take 1 tablet by mouth daily.   . nadolol (CORGARD) 20 MG tablet TAKE 1 TABLET(20 MG) BY MOUTH  DAILY. Pt needs OV for further refills   . spironolactone (ALDACTONE) 25 MG tablet Take 1 tablet (25 mg total) by mouth daily.    No facility-administered encounter medications on file as of 04/04/2020.    Current Diagnosis: Patient Active Problem List   Diagnosis Date Noted  . Spinal stenosis of lumbar region 05/08/2015  . Atypical nevus 10/31/2014  . General medical examination 01/07/2012  . BPPV (benign paroxysmal positional vertigo) 10/16/2011  . DYSURIA 05/09/2009  . Hematuria 10/12/2008  . VAGINITIS, ATROPHIC, POSTMENOPAUSAL 10/12/2008  . Osteoporosis 10/12/2008  . Hyperlipidemia 09/25/2008  . HYPERTENSION, BENIGN 09/25/2008  . SVT/ PSVT/ PAT 09/25/2008    Goals Addressed   None     Follow-Up:  Scheduled Follow-Up With Clinical Pharmacist in September  Reviewed chart prior to disease state call. Spoke with patient regarding BP  Recent Office Vitals: BP Readings from Last 3 Encounters:  10/13/18 128/76  09/19/18 106/70  01/06/18 128/88   Pulse Readings from Last 3 Encounters:  10/13/18 70  09/19/18 71  01/06/18 78    Wt Readings from Last 3 Encounters:  10/13/18 129 lb 3.2 oz (58.6 kg)  09/19/18 127 lb 9.6 oz (57.9 kg)  01/06/18 121 lb 6.4 oz (55.1 kg)     Kidney Function Lab Results  Component Value Date/Time   CREATININE 0.86 10/24/2018 10:29 AM   CREATININE 1.01 09/19/2018 02:47 PM   GFR 62.93 10/24/2018 10:29 AM   GFRNONAA 107.71 04/02/2010 10:08  AM   GFRAA 156 10/12/2008 11:18 AM    BMP Latest Ref Rng & Units 10/24/2018 09/19/2018 01/06/2018  Glucose 70 - 99 mg/dL 109(H) 111(H) 118(H)  BUN 6 - 23 mg/dL 25(H) 32(H) 17  Creatinine 0.40 - 1.20 mg/dL 0.86 1.01 0.66  Sodium 135 - 145 mEq/L 139 137 135  Potassium 3.5 - 5.1 mEq/L 4.5 5.6(H) 4.5  Chloride 96 - 112 mEq/L 101 101 100  CO2 19 - 32 mEq/L 32 28 30  Calcium 8.4 - 10.5 mg/dL 9.5 10.5 9.9    . Current antihypertensive regimen:  o Nadolol 20mg ; one tab by mouth daily. o Losartan 100mg   daily o Spironolactone 25mg  daily . How often are you checking your Blood Pressure? infrequently . Current home BP readings: 124/78 roughly a month ago . What recent interventions/DTPs have been made by any provider to improve Blood Pressure control since last CPP Visit: none at this time . Any recent hospitalizations or ED visits since last visit with CPP? No . What diet changes have been made to improve Blood Pressure Control?  o Patient states she is eating three meals everyday. Typically has eggs in the morning, soup for lunch or a sandwich, and vegetables with some sort of fish for dinner. . What exercise is being done to improve your Blood Pressure Control?  o Patient states she does not do much exercise due to her sciatic nerve. She walks with a cane.  Adherence Review: Is the patient currently on ACE/ARB medication? Yes Does the patient have >5 day gap between last estimated fill dates? No    [name], [place] Pharmacist Assistant [phone]  Reviewed by De Blanch, PharmD Clinical Pharmacist [facility] at [facility] [phone]

## 2020-05-13 ENCOUNTER — Telehealth: Payer: PPO

## 2020-05-22 ENCOUNTER — Ambulatory Visit: Payer: PPO | Admitting: Pharmacist

## 2020-05-22 ENCOUNTER — Telehealth: Payer: Self-pay | Admitting: Pharmacist

## 2020-05-22 NOTE — Patient Instructions (Signed)
Visit Information  Goals Addressed            This Visit's Progress    Chronic Care Management Pharmacy Care Plan       CARE PLAN ENTRY (see longitudinal plan of care for additional care plan information)  Current Barriers:   Chronic Disease Management support, education, and care coordination needs related to HTN, SVT, BPPV, Osteoporosis, HLD, Atrophic vaginitis, Allergic rhinitis, Overactive Bladder   Hypertension BP Readings from Last 3 Encounters:  10/13/18 128/76  09/19/18 106/70  01/06/18 128/88    Pharmacist Clinical Goal(s): o Over the next 180 days, patient will work with PharmD and providers to maintain BP goal <140/90  Current regimen:   Losartan 100mg  daily  Spironolactone 25mg  daily  Nadolol 20mg  daily  Patient self care activities - Over the next 180 days, patient will: o Maintain hypertension medication regimen.   Hyperlipidemia Lab Results  Component Value Date/Time   LDLCALC 123 (H) 09/19/2018 02:47 PM   LDLDIRECT 125.8 05/03/2013 10:37 AM    Pharmacist Clinical Goal(s): o Over the next 180 days, patient will work with PharmD and providers to achieve LDL goal < 100  Current regimen:  o Diet and exercise management    Interventions: o Discussed LDL goal  Patient self care activities - Over the next 180 days, patient will: o Consider decreasing consumption of cholesterol containing foods  Osteoporosis  Pharmacist Clinical Goal(s) o Over the next 180 days, patient will work with PharmD and providers to reduce risk of fracture due to osteoporosis  Current regimen:  o None  Interventions: o Discussed fall prevention strategies  Patient self care activities - Over the next 180 days, patient will: o Consider intake of 1200mg  of calcium daily through diet and/or supplementation o Consider intake of [phone] units of vitamin D through supplementation  Health Maintenance   Pharmacist Clinical Goal(s) o Over the next 180 days, patient  will work with PharmD and providers to complete health maintenance screenings/vaccinations  Interventions: o Discussed covid vaccine series (pt completed series) o Consider completion of flu vaccine o Consider completion of Shingrix vaccine series  Patient self care activities - Over the next 180 days, patient will: o Receive flu vaccine   Medication management  Pharmacist Clinical Goal(s): o Over the next 180 days, patient will work with PharmD and providers to maintain optimal medication adherence  Current pharmacy: [facility] to [facility]  Interventions o Comprehensive medication review performed. o Utilize [facility] pharmacy for medication synchronization, packaging and delivery  Patient self care activities - Over the next 180 days, patient will: o Focus on medication adherence by filling and taking medications appropriately  o Take medications as prescribed o Report any questions or concerns to PharmD and/or provider(s)  Please see past updates related to this goal by clicking on the "Past Updates" button in the selected goal        The patient verbalized understanding of instructions provided today and agreed to receive a mailed copy of patient instruction and/or educational materials.  Telephone follow up appointment with pharmacy team member scheduled for: 11/19/2020  Verbal consent obtained for [facility] enhanced pharmacy services (medication synchronization, adherence packaging, delivery coordination). A medication sync plan was created to allow patient to get all medications delivered once every 30 to 90 days per patient preference. Patient understands they have freedom to choose pharmacy and clinical pharmacist will coordinate care between all prescribers and [facility].    De Blanch, PharmD Clinical Pharmacist [facility]  at [place]

## 2020-05-22 NOTE — Chronic Care Management (AMB) (Signed)
Chronic Care Management Pharmacy  Name: Shannon Austin  MRN: [id number] DOB: [birth year]   Chief Complaint/ HPI  Shannon Austin,  86 y.o. , female presents for their Follow-Up CCM visit with the clinical pharmacist via telephone.  PCP : Ann Held, DO  Their chronic conditions include: HTN, SVT, BPPV, Osteoporosis, HLD, Atrophic vaginitis, Allergic rhinitis, Overactive Bladder  Office Visits: 11/16/19: Visit w/ Dr. Etter Sjogren - No med changes noted. Labs ordered. Pt wants to wait to receive COVID vaccines before coming into office.   Consult Visit: None since last CCM visit on 10/09/2019.   Medications: Outpatient Encounter Medications as of 05/22/2020  Medication Sig Note   ESTRACE VAGINAL 0.1 MG/GM vaginal cream Place 1 Applicatorful vaginally 3 (three) times a week.  04/25/2014: Received from: External Pharmacy   fish oil-omega-3 fatty acids 1000 MG capsule Take 1,200 mg by mouth daily.     ipratropium (ATROVENT) 0.06 % nasal spray Place 2 sprays into both nostrils 4 (four) times daily as needed for rhinitis. 10/09/2019: Taking once daily   losartan (COZAAR) 100 MG tablet TAKE 1 TABLET(100 MG) BY MOUTH DAILY.    Multiple Vitamins-Minerals (PRESERVISION AREDS PO) Take 1 tablet by mouth daily.    MYRBETRIQ 50 MG TB24 tablet Take 1 tablet by mouth daily.    nadolol (CORGARD) 20 MG tablet TAKE 1 TABLET(20 MG) BY MOUTH DAILY. Pt needs OV for further refills    spironolactone (ALDACTONE) 25 MG tablet Take 1 tablet (25 mg total) by mouth daily.    No facility-administered encounter medications on file as of 05/22/2020.   SDOH Screenings   Alcohol Screen:    Last Alcohol Screening Score (AUDIT): Not on file  Depression (PHQ2-9):    PHQ-2 Score: Not on file  Financial Resource Strain:    Difficulty of Paying Living Expenses: Not on file  Food Insecurity:    Worried About [place] in the Last Year: Not on file   Ran Out of Food in the Last Year: Not on file   Housing:    Last Housing Risk Score: Not on file  Physical Activity:    Days of Exercise per Week: Not on file   Minutes of Exercise per Session: Not on file  Social Connections:    Frequency of Communication with Friends and Family: Not on file   Frequency of Social Gatherings with Friends and Family: Not on file   Attends Religious Services: Not on file   Active Member of Clubs or Organizations: Not on file   Attends Archivist Meetings: Not on file   Marital Status: Not on file  Stress:    Feeling of Stress : Not on file  Tobacco Use: Medium Risk   Smoking Tobacco Use: Former Smoker   Smokeless Tobacco Use: Never Used  Transportation Needs:    Film/video editor (Medical): Not on file   Lack of Transportation (Non-Medical): Not on file     Current Diagnosis/Assessment:  Goals Addressed            This Visit's Progress    Chronic Care Management Pharmacy Care Plan       CARE PLAN ENTRY (see longitudinal plan of care for additional care plan information)  Current Barriers:   Chronic Disease Management support, education, and care coordination needs related to HTN, SVT, BPPV, Osteoporosis, HLD, Atrophic vaginitis, Allergic rhinitis, Overactive Bladder   Hypertension BP Readings from Last 3 Encounters:  10/13/18 128/76  09/19/18 106/70  01/06/18 128/88    Pharmacist Clinical Goal(s): o Over the next 180 days, patient will work with PharmD and providers to maintain BP goal <140/90  Current regimen:   Losartan 100mg  daily  Spironolactone 25mg  daily  Nadolol 20mg  daily  Patient self care activities - Over the next 180 days, patient will: o Maintain hypertension medication regimen.   Hyperlipidemia Lab Results  Component Value Date/Time   LDLCALC 123 (H) 09/19/2018 02:47 PM   LDLDIRECT 125.8 05/03/2013 10:37 AM    Pharmacist Clinical Goal(s): o Over the next 180 days, patient will work with PharmD and providers to achieve  LDL goal < 100  Current regimen:  o Diet and exercise management    Interventions: o Discussed LDL goal  Patient self care activities - Over the next 180 days, patient will: o Consider decreasing consumption of cholesterol containing foods  Osteoporosis  Pharmacist Clinical Goal(s) o Over the next 180 days, patient will work with PharmD and providers to reduce risk of fracture due to osteoporosis  Current regimen:  o None  Interventions: o Discussed fall prevention strategies  Patient self care activities - Over the next 180 days, patient will: o Consider intake of 1200mg  of calcium daily through diet and/or supplementation o Consider intake of [phone] units of vitamin D through supplementation  Health Maintenance   Pharmacist Clinical Goal(s) o Over the next 180 days, patient will work with PharmD and providers to complete health maintenance screenings/vaccinations  Interventions: o Discussed covid vaccine series (pt completed series) o Consider completion of flu vaccine o Consider completion of Shingrix vaccine series  Patient self care activities - Over the next 180 days, patient will: o Receive flu vaccine   Medication management  Pharmacist Clinical Goal(s): o Over the next 180 days, patient will work with PharmD and providers to maintain optimal medication adherence  [facility]  Interventions o Comprehensive medication review performed. o Utilize [facility] pharmacy for medication synchronization, packaging and delivery  Patient self care activities - Over the next 180 days, patient will: o Focus on medication adherence by filling and taking medications appropriately  o Take medications as prescribed o Report any questions or concerns to PharmD and/or provider(s)  Please see past updates related to this goal by clicking on the "Past Updates" button in the selected goal        Social Hx: Going to [employer]. Using  pill box and fills them up 2 weeks at a time  Very interested in getting COVID vaccine. Is having a hard time navigating how to get one.   Hypertension/SVT   BP goal is:  <140/90  Office blood pressures are  BP Readings from Last 3 Encounters:  10/13/18 128/76  09/19/18 106/70  01/06/18 128/88   Patient checks BP at home Never (does not have BP cuff) Patient home BP readings are ranging: Unable to assess  Patient has failed these meds in the past:  lisinopril (cough), hctz (muscle spasm) Patient is currently controlled on the following medications:  Losartan 100mg  daily  Spironolactone 25mg  daily  Nadolol 20mg  daily  Problem Story Gross hematuria over 10 years ago where she had SVT and prescribed and stayed on nadalol  Update 05/22/20 Feels her appetite is excellent.  Still does not have BP cuff. Denies HA, CP, dizziness She states there are barriers to her coming in to have labs drawn. She is requesting 36 DS of all scripts  Plan -Continue current medications   Osteoporosis   Last  DEXA Scan: 04/15/2015  T-Score femoral neck: -2.9 (L)   VITD  Date Value Ref Range Status  10/31/2014 39.42 30.00 - 100.00 ng/mL Final     Patient is a candidate for pharmacologic treatment due to T-Score < -2.5 in femoral neck  Patient has failed these meds in past: fosamax (stopped taking due to lifestyle; not tolerating being NPO in order to take med) Patient is currently uncontrolled on the following medications:   None  Not interested in treatment Will continue discussions for the future.  Plan -Consider intake of 1200mg  of calcium daily through diet and/or supplementation -Consider intake of [phone] units of vitamin D through supplementation   Vaccines   Reviewed and discussed patient's vaccination history.    Immunization History  Administered Date(s) Administered   Influenza Split 05/10/2013   Influenza Whole 06/14/2009, 06/23/2010   Influenza, High Dose  Seasonal PF 05/04/2017, 05/21/2018   Influenza,inj,Quad PF,6+ Mos 06/01/2014, 05/08/2015   Pneumococcal Conjugate-13 10/31/2014   Pneumococcal Polysaccharide-23 04/02/2010   Zoster 08/31/2012   States she has received her COVID Vaccines.  She was going to come into the office, but got concerned about Delta variant.  She tolerated both vaccines well. Moderna 01/07/2020 02/10/2020  Plan -Update vaccine record -Receive flu vaccine -Consider completing Shingrix vaccine series  Verbal consent obtained for [facility] enhanced pharmacy services (medication synchronization, adherence packaging, delivery coordination). A medication sync plan was created to allow patient to get all medications delivered once every 30 to 90 days per patient preference. Patient understands they have freedom to choose pharmacy and clinical pharmacist will coordinate care between all prescribers and [facility].  De Blanch, PharmD Clinical Pharmacist [facility] at [facility] [phone]

## 2020-05-22 NOTE — Progress Notes (Addendum)
Chronic Care Management Pharmacy Assistant   Name: RICHANDA DARIN  MRN: [id number] DOB: 14-Nov-1932  Reason for Encounter: Coordination of Enhanced Pharmacy Services   PCP : Carollee Herter, Alferd Apa, DO  Allergies:   Allergies  Allergen Reactions  . Amlodipine Besylate   . Ezetimibe   . Hydrochlorothiazide   . Latex   . Lisinopril Cough  . Statins     Muscle weakness  . Sulfonamide Derivatives Rash and Other (See Comments)    HA    Medications: Outpatient Encounter Medications as of 05/22/2020  Medication Sig Note  . ESTRACE VAGINAL 0.1 MG/GM vaginal cream Place 1 Applicatorful vaginally 3 (three) times a week.  04/25/2014: Received from: External Pharmacy  . fish oil-omega-3 fatty acids 1000 MG capsule Take 1,200 mg by mouth daily.    Marland Kitchen ipratropium (ATROVENT) 0.06 % nasal spray Place 2 sprays into both nostrils 4 (four) times daily as needed for rhinitis. 10/09/2019: Taking once daily  . losartan (COZAAR) 100 MG tablet TAKE 1 TABLET(100 MG) BY MOUTH DAILY.   . Multiple Vitamins-Minerals (PRESERVISION AREDS PO) Take 1 tablet by mouth daily.   Marland Kitchen MYRBETRIQ 50 MG TB24 tablet Take 1 tablet by mouth daily.   . nadolol (CORGARD) 20 MG tablet TAKE 1 TABLET(20 MG) BY MOUTH DAILY. Pt needs OV for further refills   . spironolactone (ALDACTONE) 25 MG tablet Take 1 tablet (25 mg total) by mouth daily.    No facility-administered encounter medications on file as of 05/22/2020.    Current Diagnosis: Patient Active Problem List   Diagnosis Date Noted  . Spinal stenosis of lumbar region 05/08/2015  . Atypical nevus 10/31/2014  . General medical examination 01/07/2012  . BPPV (benign paroxysmal positional vertigo) 10/16/2011  . DYSURIA 05/09/2009  . Hematuria 10/12/2008  . VAGINITIS, ATROPHIC, POSTMENOPAUSAL 10/12/2008  . Osteoporosis 10/12/2008  . Hyperlipidemia 09/25/2008  . HYPERTENSION, BENIGN 09/25/2008  . SVT/ PSVT/ PAT 09/25/2008    Goals Addressed   None    05-22-2020:    Made outbound call. Spoke with Freda Munro from Dr. Puschinsky's office to request Mybetriq 50 mg tab and Estradiol 0.01 % vaginal cream to be sent to [facility]. She stated she would forward my request to a clinical staff member and someone would contact me for further information.  Spoke with Roselyn Reef from Dr. Tawanna Sat to request ipratropium (ATROVENT) 0.06 % nasal spray to be sent to [facility]. Was advised that patient needed to be seen in the office before a new prescription would be issued.  Contacted the patient to inform her of what Dr. Tawanna Sat office advised. Patient stated she only see's Dr. Laurance Flatten to get her ear cleaned. She did not go to her last visit because she didn't need that service. She does however need the nasal spray. Advised the patient to contact Dr. Tawanna Sat office and schedule a follow up.  Received a call back from Dr. Dr. Puschinsky's office. A voicemail was left stating Dr. Dr. Harlow Asa would not be able to send prescriptions for Mybetriq 50 mg tab or Estradiol 0.01 % vaginal cream until the patient was sen in the office. She has not been seen by the doctor since 2019.  Contacted [facility] to ask for medication's be transferred. Was informed that the pharmacist was on lunch, and that was the only person who woul dbe able to do a medication transfer.  Called the patient back to inform her of what Dr. Puschinsky's office said. Patient stated she would call that  office, but she didn't need the nasal spray at this time so she would hold off on making an appointment with Dr. Laurance Flatten.  Follow-Up:  Coordination of [place], [place] Pharmacist Assistant [phone]

## 2020-05-23 ENCOUNTER — Other Ambulatory Visit: Payer: Self-pay

## 2020-05-23 DIAGNOSIS — I10 Essential (primary) hypertension: Secondary | ICD-10-CM

## 2020-05-23 MED ORDER — SPIRONOLACTONE 25 MG PO TABS: 25.0000 mg | ORAL_TABLET | Freq: Every day | ORAL | 1 refills | Status: DC

## 2020-05-23 MED ORDER — NADOLOL 20 MG PO TABS: ORAL_TABLET | ORAL | 0 refills | Status: DC

## 2020-05-23 MED ORDER — LOSARTAN POTASSIUM 100 MG PO TABS: ORAL_TABLET | ORAL | 1 refills | Status: DC

## 2020-06-10 ENCOUNTER — Telehealth: Payer: Self-pay | Admitting: Pharmacist

## 2020-06-10 NOTE — Progress Notes (Addendum)
    Chronic Care Management Pharmacy Assistant   Name: Shannon Austin  MRN: [id number] DOB: 07/24/1933  Reason for Encounter: Medication Review  PCP : Ann Held, DO  Allergies:   Allergies  Allergen Reactions  . Amlodipine Besylate   . Ezetimibe   . Hydrochlorothiazide   . Latex   . Lisinopril Cough  . Statins     Muscle weakness  . Sulfonamide Derivatives Rash and Other (See Comments)    HA    Medications: Outpatient Encounter Medications as of 06/10/2020  Medication Sig Note  . ESTRACE VAGINAL 0.1 MG/GM vaginal cream Place 1 Applicatorful vaginally 3 (three) times a week.  04/25/2014: Received from: External Pharmacy  . fish oil-omega-3 fatty acids 1000 MG capsule Take 1,200 mg by mouth daily.    Marland Kitchen ipratropium (ATROVENT) 0.06 % nasal spray Place 2 sprays into both nostrils 4 (four) times daily as needed for rhinitis. 10/09/2019: Taking once daily  . losartan (COZAAR) 100 MG tablet TAKE 1 TABLET(100 MG) BY MOUTH DAILY.   . Multiple Vitamins-Minerals (PRESERVISION AREDS PO) Take 1 tablet by mouth daily.   Marland Kitchen MYRBETRIQ 50 MG TB24 tablet Take 1 tablet by mouth daily.   . nadolol (CORGARD) 20 MG tablet TAKE 1 TABLET(20 MG) BY MOUTH DAILY. Pt needs OV for further refills   . spironolactone (ALDACTONE) 25 MG tablet Take 1 tablet (25 mg total) by mouth daily.    No facility-administered encounter medications on file as of 06/10/2020.    Current Diagnosis: Patient Active Problem List   Diagnosis Date Noted  . Spinal stenosis of lumbar region 05/08/2015  . Atypical nevus 10/31/2014  . General medical examination 01/07/2012  . BPPV (benign paroxysmal positional vertigo) 10/16/2011  . DYSURIA 05/09/2009  . Hematuria 10/12/2008  . VAGINITIS, ATROPHIC, POSTMENOPAUSAL 10/12/2008  . Osteoporosis 10/12/2008  . Hyperlipidemia 09/25/2008  . HYPERTENSION, BENIGN 09/25/2008  . SVT/ PSVT/ PAT 09/25/2008    Goals Addressed   None    Reviewed chart for medication changes  ahead of medication coordination call.  No OVs, Consults, or hospital visits since last Pharmacist visit.   No medication changes indicated  BP Readings from Last 3 Encounters:  10/13/18 128/76  09/19/18 106/70  01/06/18 128/88    Lab Results  Component Value Date   HGBA1C 5.5 01/06/2018     Patient obtains medications through Adherence Packaging  90 Days   This is the patient's first adherence delivery.  Patient is due for next adherence delivery on: 06-14-2020. Called patient and reviewed medications and coordinated delivery.  This delivery to include: Spironolactone 25 mg; one tab with Breakfast Losartan 100 mg; one tab with Breakfast Nadolol 20 mg; one tab with Breakfast Ipratropium 0.06% Nasal Spray   Patient declined the following medications due to little use: Mybetriq 50 mg (Patient tried to obtain refill without a doctor visit, but was unsuccessful. She decided to stop taking this medication. She does not feel comfortable going into the doctor's office) Estradiol 0.01% vaginal cream (patient states she uses infrequently and does not need any at this time)  Patient does need refills at this time.  Confirmed delivery date of 06-14-2020, advised patient that pharmacy will contact them the morning of delivery.  Follow-Up:  Coordination of Enhanced Pharmacy Services and Pharmacist Review   [name], [place] Pharmacist Assistant [phone]  Reviewed by: De Blanch, PharmD Clinical Pharmacist [place] [facility] at [facility] [phone]

## 2020-06-12 ENCOUNTER — Telehealth: Payer: Self-pay | Admitting: Pharmacist

## 2020-06-12 NOTE — Progress Notes (Addendum)
Patient requesting sooner delivery.  Advised that I would have delivery for Thursday October 14th.  [name], [place] Pharmacist Assistant [phone]  Reviewed by: De Blanch, PharmD Clinical Pharmacist [place] [facility] at [facility] [phone]

## 2020-07-18 ENCOUNTER — Telehealth: Payer: Self-pay | Admitting: Pharmacist

## 2020-07-18 NOTE — Progress Notes (Addendum)
Chronic Care Management Pharmacy Assistant   Name: Shannon Austin  MRN: [id number] DOB: 1932/11/02  Reason for Encounter: Medication Review   PCP : Ann Held, DO  Allergies:   Allergies  Allergen Reactions   Amlodipine Besylate    Ezetimibe    Hydrochlorothiazide    Latex    Lisinopril Cough   Statins     Muscle weakness   Sulfonamide Derivatives Rash and Other (See Comments)    HA    Medications: Outpatient Encounter Medications as of 07/18/2020  Medication Sig Note   ESTRACE VAGINAL 0.1 MG/GM vaginal cream Place 1 Applicatorful vaginally 3 (three) times a week.  06/12/2020: Uses infrequently   fish oil-omega-3 fatty acids 1000 MG capsule Take 1,200 mg by mouth daily.     ipratropium (ATROVENT) 0.06 % nasal spray Place 2 sprays into both nostrils 4 (four) times daily as needed for rhinitis. 10/09/2019: Taking once daily   losartan (COZAAR) 100 MG tablet TAKE 1 TABLET(100 MG) BY MOUTH DAILY.    Multiple Vitamins-Minerals (PRESERVISION AREDS PO) Take 1 tablet by mouth daily.    MYRBETRIQ 50 MG TB24 tablet Take 1 tablet by mouth daily. 06/12/2020: Needs visit before refills will be given, but pt uncomfortable going in office   nadolol (CORGARD) 20 MG tablet TAKE 1 TABLET(20 MG) BY MOUTH DAILY. Pt needs OV for further refills    spironolactone (ALDACTONE) 25 MG tablet Take 1 tablet (25 mg total) by mouth daily.    No facility-administered encounter medications on file as of 07/18/2020.    Current Diagnosis: Patient Active Problem List   Diagnosis Date Noted   Spinal stenosis of lumbar region 05/08/2015   Atypical nevus 10/31/2014   General medical examination 01/07/2012   BPPV (benign paroxysmal positional vertigo) 10/16/2011   DYSURIA 05/09/2009   Hematuria 10/12/2008   VAGINITIS, ATROPHIC, POSTMENOPAUSAL 10/12/2008   Osteoporosis 10/12/2008   Hyperlipidemia 09/25/2008   HYPERTENSION, BENIGN 09/25/2008   SVT/ PSVT/ PAT 09/25/2008    Goals Addressed     None    Need estradiol cream or ipratropium nasal spray?  Reviewed chart for medication changes ahead of medication coordination call.  No OVs, Consults, or hospital visits since last care coordination call/Pharmacist visit. (If appropriate, list visit date, provider name)  No medication changes indicated OR if recent visit, treatment plan here.  BP Readings from Last 3 Encounters:  10/13/18 128/76  09/19/18 106/70  01/06/18 128/88    Lab Results  Component Value Date   HGBA1C 5.5 01/06/2018     Patient obtains medications through Adherence Packaging  90 Days   Last adherence delivery included:  Spironolactone 25 mg; one tab with Breakfast Losartan 100 mg; one tab with Breakfast Nadolol 20 mg; one tab with Breakfast Ipratropium 0.06% Nasal Spray  Patient declined the following medications last month due to little use: Mybetriq 50 mg (Patient tried to obtain refill without a doctor visit, but was unsuccessful. She decided to stop taking this medication. She does not feel comfortable going into the doctor's office) Estradiol 0.01% vaginal cream (patient states she uses infrequently and does not need any at this time)  Patient is due for next adherence delivery on: 07-23-2020. Called patient and reviewed medications and coordinated delivery.  This delivery to include: Nadolol 20 mg; one tab with Breakfast (enough supply to sync up with other meds on 09/12/2019)   Patient declined the following medications due to receiving a 90 day supply on 06-13-2020: Spironolactone 25 mg; one  tab with Breakfast Losartan 100 mg; one tab with Breakfast Ipratropium 0.06% Nasal Spray  Patient does need any refills at this time.  Confirmed delivery date of 07-23-2020, advised patient that pharmacy will contact them the morning of delivery.  Follow-Up:  Coordination of Enhanced Pharmacy Services and Pharmacist Review   [name], [place] Pharmacist  Assistant [phone]  Reviewed by: De Blanch, PharmD Clinical Pharmacist [place] [facility] at [facility] [phone]

## 2020-09-04 ENCOUNTER — Telehealth: Payer: Self-pay | Admitting: Pharmacist

## 2020-09-04 NOTE — Progress Notes (Addendum)
Chronic Care Management Pharmacy Assistant   Name: Shannon Austin  MRN: [id number] DOB: 02-28-1933  Reason for Encounter: Medication Review   PCP : Ann Held, DO  Allergies:   Allergies  Allergen Reactions   Amlodipine Besylate    Ezetimibe    Hydrochlorothiazide    Latex    Lisinopril Cough   Statins     Muscle weakness   Sulfonamide Derivatives Rash and Other (See Comments)    HA    Medications: Outpatient Encounter Medications as of 09/04/2020  Medication Sig Note   ESTRACE VAGINAL 0.1 MG/GM vaginal cream Place 1 Applicatorful vaginally 3 (three) times a week.  06/12/2020: Uses infrequently   fish oil-omega-3 fatty acids 1000 MG capsule Take 1,200 mg by mouth daily.     ipratropium (ATROVENT) 0.06 % nasal spray Place 2 sprays into both nostrils 4 (four) times daily as needed for rhinitis. 10/09/2019: Taking once daily   losartan (COZAAR) 100 MG tablet TAKE 1 TABLET(100 MG) BY MOUTH DAILY.    Multiple Vitamins-Minerals (PRESERVISION AREDS PO) Take 1 tablet by mouth daily.    MYRBETRIQ 50 MG TB24 tablet Take 1 tablet by mouth daily. 06/12/2020: Needs visit before refills will be given, but pt uncomfortable going in office   nadolol (CORGARD) 20 MG tablet TAKE 1 TABLET(20 MG) BY MOUTH DAILY. Pt needs OV for further refills    spironolactone (ALDACTONE) 25 MG tablet Take 1 tablet (25 mg total) by mouth daily.    No facility-administered encounter medications on file as of 09/04/2020.    Current Diagnosis: Patient Active Problem List   Diagnosis Date Noted   Spinal stenosis of lumbar region 05/08/2015   Atypical nevus 10/31/2014   General medical examination 01/07/2012   BPPV (benign paroxysmal positional vertigo) 10/16/2011   DYSURIA 05/09/2009   Hematuria 10/12/2008   VAGINITIS, ATROPHIC, POSTMENOPAUSAL 10/12/2008   Osteoporosis 10/12/2008   Hyperlipidemia 09/25/2008   HYPERTENSION, BENIGN 09/25/2008   SVT/ PSVT/ PAT 09/25/2008    Goals Addressed    None    Reviewed chart for medication changes ahead of medication coordination call.  No OVs, Consults, or hospital visits since last care coordination call/Pharmacist visit. (If appropriate, list visit date, provider name)  No medication changes indicated OR if recent visit, treatment plan here.  BP Readings from Last 3 Encounters:  10/13/18 128/76  09/19/18 106/70  01/06/18 128/88    Lab Results  Component Value Date   HGBA1C 5.5 01/06/2018     Patient obtains medications through Adherence Packaging  90 Days   Last adherence delivery included:  Nadolol 20 mg; one tab with Breakfast  Patient declined the following medications last month due to receiving a 90 day supply on 06-13-2020: Spironolactone 25 mg; one tab with Breakfast Losartan 100 mg; one tab with Breakfast Ipratropium 0.06% Nasal Spray   Patient is due for next adherence delivery on 09-10-2020: . Called patient and reviewed medications and coordinated delivery.  This delivery to include: Spironolactone 25 mg; one tab with Breakfast Losartan 100 mg; one tab with Breakfast Nadolol 20 mg; one tab with Breakfast Ipratropium 0.06% Nasal Spray   Patient declined the following medications: Mybetriq 50 mg (Patient tried to obtain refill without a doctor visit, but was unsuccessful. She decided to stop taking this medication. She does not feel comfortable going into the doctor's office) Estradiol 0.01% vaginal cream (patient states she uses infrequently and does not need any at this time)  Patient needs refills for Nadolol 20  mg tab. A request has been sent to PCP.  Confirmed delivery date of 09-10-2020, advised patient that pharmacy will contact them the morning of delivery.  Follow-Up:  Coordination of Enhanced Pharmacy Services and Pharmacist Review   Corwin Levins, [facility] Clinical Pharmacist Assistant [phone]  7 minutes spent in review, coordination, and documentation.   Reviewed by: Katrinka Blazing, PharmD,  BCACP Clinical Pharmacist [place] [facility] at [facility] [phone]

## 2020-09-05 ENCOUNTER — Other Ambulatory Visit: Payer: Self-pay

## 2020-09-05 DIAGNOSIS — I10 Essential (primary) hypertension: Secondary | ICD-10-CM

## 2020-09-05 MED ORDER — NADOLOL 20 MG PO TABS: 20.0000 mg | ORAL_TABLET | Freq: Every day | ORAL | 0 refills | Status: DC

## 2020-10-02 ENCOUNTER — Telehealth: Payer: Self-pay | Admitting: Pharmacist

## 2020-10-02 NOTE — Progress Notes (Addendum)
° ° °  Chronic Care Management Pharmacy Assistant   Name: Shannon Austin  MRN: [id number] DOB: 07/24/1933  Reason for Encounter: Medication Review  PCP : Ann Held, DO  Allergies:   Allergies  Allergen Reactions   Amlodipine Besylate    Ezetimibe    Hydrochlorothiazide    Latex    Lisinopril Cough   Statins     Muscle weakness   Sulfonamide Derivatives Rash and Other (See Comments)    HA    Medications: Outpatient Encounter Medications as of 10/02/2020  Medication Sig Note   ESTRACE VAGINAL 0.1 MG/GM vaginal cream Place 1 Applicatorful vaginally 3 (three) times a week.  06/12/2020: Uses infrequently   fish oil-omega-3 fatty acids 1000 MG capsule Take 1,200 mg by mouth daily.     ipratropium (ATROVENT) 0.06 % nasal spray Place 2 sprays into both nostrils 4 (four) times daily as needed for rhinitis. 10/09/2019: Taking once daily   losartan (COZAAR) 100 MG tablet TAKE 1 TABLET(100 MG) BY MOUTH DAILY.    Multiple Vitamins-Minerals (PRESERVISION AREDS PO) Take 1 tablet by mouth daily.    MYRBETRIQ 50 MG TB24 tablet Take 1 tablet by mouth daily. 06/12/2020: Needs visit before refills will be given, but pt uncomfortable going in office   nadolol (CORGARD) 20 MG tablet Take 1 tablet (20 mg total) by mouth daily.    spironolactone (ALDACTONE) 25 MG tablet Take 1 tablet (25 mg total) by mouth daily.    No facility-administered encounter medications on file as of 10/02/2020.    Current Diagnosis: Patient Active Problem List   Diagnosis Date Noted   Spinal stenosis of lumbar region 05/08/2015   Atypical nevus 10/31/2014   General medical examination 01/07/2012   BPPV (benign paroxysmal positional vertigo) 10/16/2011   DYSURIA 05/09/2009   Hematuria 10/12/2008   VAGINITIS, ATROPHIC, POSTMENOPAUSAL 10/12/2008   Osteoporosis 10/12/2008   Hyperlipidemia 09/25/2008   HYPERTENSION, BENIGN 09/25/2008   SVT/ PSVT/ PAT 09/25/2008    Goals Addressed   None    Reviewed chart for  medication changes ahead of medication coordination call.  No OVs, Consults, or hospital visits since last care coordination call.  No medication changes indicated.  BP Readings from Last 3 Encounters:  10/13/18 128/76  09/19/18 106/70  01/06/18 128/88    Lab Results  Component Value Date   HGBA1C 5.5 01/06/2018     Patient obtains medications through Adherence Packaging  90 Days   Last adherence delivery included: Spironolactone 25 mg; one tab with Breakfast Losartan 100 mg; one tab with Breakfast Nadolol 20 mg; one tab with Breakfast Ipratropium 0.06% Nasal Spray  Patient declined meds last month: Mybetriq 50 mg (Patient tried to obtain refill without a doctor visit, but was unsuccessful. She decided to stop taking this medication. She does not feel comfortable going into the doctor's office) Estradiol 0.01% vaginal cream (patient states she uses infrequently and does not need any at this time)  Patient is not due for adherence delivery at this time.  Called patient and reviewed medications and coordinated delivery.  This delivery to include: None.  Patient declined the following medications: Patient just received 90 DS on 09/10/20  Patient doesn't need any refills at this time.   Follow-Up:  Coordination of Enhanced Pharmacy Services and Pharmacist Review   Charlann Lange, [place] Pharmacist Assistant [phone]  5 minutes spent in review, coordination, and documentation.  Reviewed by: Beverly Milch, PharmD Clinical Pharmacist [place] [facility] [phone]

## 2020-10-31 ENCOUNTER — Telehealth: Payer: Self-pay | Admitting: Pharmacist

## 2020-10-31 NOTE — Progress Notes (Addendum)
° ° °  Chronic Care Management Pharmacy Assistant   Name: CHEYENNA PANKOWSKI  MRN: [id number] DOB: 04-08-1933  Reason for Encounter: Medication Review  PCP : Ann Held, DO  Allergies:   Allergies  Allergen Reactions   Amlodipine Besylate    Ezetimibe    Hydrochlorothiazide    Latex    Lisinopril Cough   Statins     Muscle weakness   Sulfonamide Derivatives Rash and Other (See Comments)    HA    Medications: Outpatient Encounter Medications as of 10/31/2020  Medication Sig Note   ESTRACE VAGINAL 0.1 MG/GM vaginal cream Place 1 Applicatorful vaginally 3 (three) times a week.  06/12/2020: Uses infrequently   fish oil-omega-3 fatty acids 1000 MG capsule Take 1,200 mg by mouth daily.     ipratropium (ATROVENT) 0.06 % nasal spray Place 2 sprays into both nostrils 4 (four) times daily as needed for rhinitis. 10/09/2019: Taking once daily   losartan (COZAAR) 100 MG tablet TAKE 1 TABLET(100 MG) BY MOUTH DAILY.    Multiple Vitamins-Minerals (PRESERVISION AREDS PO) Take 1 tablet by mouth daily.    MYRBETRIQ 50 MG TB24 tablet Take 1 tablet by mouth daily. 06/12/2020: Needs visit before refills will be given, but pt uncomfortable going in office   nadolol (CORGARD) 20 MG tablet Take 1 tablet (20 mg total) by mouth daily.    spironolactone (ALDACTONE) 25 MG tablet Take 1 tablet (25 mg total) by mouth daily.    No facility-administered encounter medications on file as of 10/31/2020.    Current Diagnosis: Patient Active Problem List   Diagnosis Date Noted   Spinal stenosis of lumbar region 05/08/2015   Atypical nevus 10/31/2014   General medical examination 01/07/2012   BPPV (benign paroxysmal positional vertigo) 10/16/2011   DYSURIA 05/09/2009   Hematuria 10/12/2008   VAGINITIS, ATROPHIC, POSTMENOPAUSAL 10/12/2008   Osteoporosis 10/12/2008   Hyperlipidemia 09/25/2008   HYPERTENSION, BENIGN 09/25/2008   SVT/ PSVT/ PAT 09/25/2008    Goals Addressed   None    Reviewed chart for  medication changes ahead of medication coordination call.  No OVs, Consults, or hospital visits since last care coordination call.  BP Readings from Last 3 Encounters:  10/13/18 128/76  09/19/18 106/70  01/06/18 128/88    Lab Results  Component Value Date   HGBA1C 5.5 01/06/2018     Patient obtains medications through Adherence Packaging  90 Days   Last adherence delivery included: On 09/10/20 Spironolactone 25 mg; one tab with Breakfast Losartan 100 mg; one tab with Breakfast Nadolol 20 mg; one tab with Breakfast Ipratropium 0.06% Nasal Spray  Patient declined (meds) last month: 90 DS on 09/10/20 Spironolactone 25 mg; one tab with Breakfast Losartan 100 mg; one tab with Breakfast Nadolol 20 mg; one tab with Breakfast Ipratropium 0.06% Nasal Spray  Patient is not due for next adherence delivery on at this time.  Called patient and reviewed medications and coordinated delivery.  This delivery to include: None  Patient declined the following medications: pironolactone 25 mg; one tab with Breakfast Losartan 100 mg; one tab with Breakfast Nadolol 20 mg; one tab with Breakfast Ipratropium 0.06% Nasal Spray  Patient does not need any refills at this time.  Follow-Up:  Coordination of Enhanced Pharmacy Services and Pharmacist Review   Charlann Lange, [place] Pharmacist Assistant [phone]  6 minutes spent in review, coordination, and documentation.  Reviewed by: Beverly Milch, PharmD Clinical Pharmacist [facility] [phone]

## 2020-11-13 NOTE — Progress Notes (Deleted)
Chronic Care Management Pharmacy Note  11/13/2020 Name:  Shannon Austin MRN:  [id number] DOB:  07/07/1933  Subjective: Shannon Austin is an 87 y.o. year old female who is a primary patient of Ann Held, DO.  The CCM team was consulted for assistance with disease management and care coordination needs.    Engaged with patient by telephone for follow up visit in response to provider referral for pharmacy case management and/or care coordination services.   Consent to Services:  The patient was given information about Chronic Care Management services, agreed to services, and gave verbal consent prior to initiation of services.  Please see initial visit note for detailed documentation.   Patient Care Team: Carollee Herter, Alferd Apa, DO as PCP - General (Family Medicine) Puschinsky, Fransico Him., MD (General Surgery) Maggie Schwalbe., MD as Consulting Physician (Otolaryngology) Deirdre Pippins, PA-C as Physician Assistant (Internal Medicine) Odis Luster (Dentistry) Day, Melvenia Beam, [facility] (Inactive) as Pharmacist (Pharmacist)  Recent office visits: Last OV with PCP was 11/16/2019; Message sent to front reception office regarding scheduling with PCP.   Recent consult visits: None in last 6 months  Hospital visits: None in previous 6 months  Objective:  Lab Results  Component Value Date   CREATININE 0.86 10/24/2018   CREATININE 1.01 09/19/2018   CREATININE 0.66 01/06/2018    Lab Results  Component Value Date   HGBA1C 5.5 01/06/2018   Last diabetic Eye exam: No results found for: HMDIABEYEEXA  Last diabetic Foot exam: No results found for: HMDIABFOOTEX      Component Value Date/Time   CHOL 205 (H) 09/19/2018 1447   TRIG 91.0 09/19/2018 1447   HDL 64.10 09/19/2018 1447   CHOLHDL 3 09/19/2018 1447   VLDL 18.2 09/19/2018 1447   LDLCALC 123 (H) 09/19/2018 1447   LDLDIRECT 125.8 05/03/2013 1037    Hepatic Function Latest Ref Rng & Units 10/24/2018 09/19/2018 01/06/2018   Total Protein 6.0 - 8.3 g/dL 6.9 7.2 7.3  Albumin 3.5 - 5.2 g/dL 4.1 4.3 4.2  AST 0 - 37 U/L _0 ALT 0 - 35 U/L _1 Alk Phosphatase 39 - 117 U/L 74 76 69  Total Bilirubin 0.2 - 1.2 mg/dL 0.6 0.5 0.7  Bilirubin, Direct 0.0 - 0.3 mg/dL - - -    Lab Results  Component Value Date/Time   TSH 1.57 10/31/2014 10:43 AM   TSH 1.32 05/03/2013 10:37 AM    CBC Latest Ref Rng & Units 09/19/2018 05/04/2017 04/02/2016  WBC 4.0 - 10.5 K/uL 8.2 6.4 7.5  Hemoglobin 12.0 - 15.0 g/dL 14.4 15.1(H) 14.2  Hematocrit 36.0 - 46.0 % 43.0 45.8 42.9  Platelets 150.0 - 400.0 K/uL 247.0 236.0 249.0    Lab Results  Component Value Date/Time   VD25OH 39.42 10/31/2014 10:43 AM   VD25OH 52 04/02/2010 08:34 PM   VD25OH 37 11/26/2008 08:17 PM    Clinical ASCVD: No  The ASCVD Risk score Mikey Bussing DC Jr., et al., 2013) failed to calculate for the following reasons:   The 2013 ASCVD risk score is only valid for ages 56 to 11    Other: (CHADS2VASc if Afib, PHQ9 if depression, MMRC or CAT for COPD, ACT, DEXA)  Social History   Tobacco Use  Smoking Status Former Smoker  Smokeless Tobacco Never Used   BP Readings from Last 3 Encounters:  10/13/18 128/76  09/19/18 106/70  01/06/18 128/88   Pulse Readings from Last 3 Encounters:  10/13/18  70  09/19/18 71  01/06/18 78   Wt Readings from Last 3 Encounters:  10/13/18 129 lb 3.2 oz (58.6 kg)  09/19/18 127 lb 9.6 oz (57.9 kg)  01/06/18 121 lb 6.4 oz (55.1 kg)    Assessment: Review of patient past medical history, allergies, medications, health status, including review of consultants reports, laboratory and other test data, was performed as part of comprehensive evaluation and provision of chronic care management services.   SDOH:  (Social Determinants of Health) assessments and interventions performed:    CCM Care Plan  Allergies  Allergen Reactions  . Amlodipine Besylate   . Ezetimibe   . Hydrochlorothiazide   . Latex   . Lisinopril Cough   . Statins     Muscle weakness  . Sulfonamide Derivatives Rash and Other (See Comments)    HA    Medications Reviewed Today    Reviewed by De Blanch, [facility] (Pharmacist) on 09/05/20 at 0955  Med List Status: <None>  Medication Order Taking? Sig Documenting Provider Last Dose Status Informant  ESTRACE VAGINAL 0.1 MG/GM vaginal cream [id number]  Place 1 Applicatorful vaginally 3 (three) times a week.  [provider]  Active            Med Note Vivien Presto Jun 12, 2020  5:18 PM) Uses infrequently  fish oil-omega-3 fatty acids 1000 MG capsule [id number]  Take 1,200 mg by mouth daily.  [provider]  Active   ipratropium (ATROVENT) 0.06 % nasal spray [id number] Yes Place 2 sprays into both nostrils 4 (four) times daily as needed for rhinitis. [provider] Taking Active            Med Note Quinn Axe Oct 09, 2019  2:00 PM) Taking once daily  losartan (COZAAR) 100 MG tablet [id number] Yes TAKE 1 TABLET(100 MG) BY MOUTH DAILY. Ann Held, DO Taking Active   Multiple Vitamins-Minerals (PRESERVISION AREDS PO) [id number]  Take 1 tablet by mouth daily. [provider]  Active   MYRBETRIQ 50 MG TB24 tablet [id number]  Take 1 tablet by mouth daily. [provider]  Active            Med Note Vivien Presto Jun 12, 2020  5:18 PM) Needs visit before refills will be given, but pt uncomfortable going in office  nadolol (CORGARD) 20 MG tablet [id number] Yes TAKE 1 TABLET(20 MG) BY MOUTH DAILY. Pt needs OV for further refills Ann Held, DO Taking Active   spironolactone (ALDACTONE) 25 MG tablet [id number] Yes Take 1 tablet (25 mg total) by mouth daily. Ann Held, DO Taking Active           Patient Active Problem List   Diagnosis Date Noted  . Spinal stenosis of lumbar region 05/08/2015  . Atypical nevus 10/31/2014  . General medical examination 01/07/2012  . BPPV (benign paroxysmal positional vertigo)  10/16/2011  . DYSURIA 05/09/2009  . Hematuria 10/12/2008  . VAGINITIS, ATROPHIC, POSTMENOPAUSAL 10/12/2008  . Osteoporosis 10/12/2008  . Hyperlipidemia 09/25/2008  . HYPERTENSION, BENIGN 09/25/2008  . SVT/ PSVT/ PAT 09/25/2008    Immunization History  Administered Date(s) Administered  . Influenza Split 05/10/2013  . Influenza Whole 06/14/2009, 06/23/2010  . Influenza, High Dose Seasonal PF 05/04/2017, 05/21/2018  . Influenza,inj,Quad PF,6+ Mos 06/01/2014, 05/08/2015  . Moderna Sars-Covid-2 Vaccination 01/07/2020, 02/10/2020  . Pneumococcal Conjugate-13 10/31/2014  . Pneumococcal Polysaccharide-23 04/02/2010  . Zoster 08/31/2012    Conditions  to be addressed/monitored: HTN, HLD and over active bladder  There are no care plans that you recently modified to display for this patient.  Medication Adherence: *** Medication Assistance: {MEDASSISTANCEINFO:25044}   Patient is using 90 day adherence program thru [facility]  Patient's preferred pharmacy is:  [facility] - [place], [place] - [address] [place] [address] Phone: [phone] Fax: [phone]  Uses pill box? {Yes or If no, why not?:20788} Pt endorses ***% compliance  Follow Up:  {FOLLOWUP:24991}  Plan: {CM FOLLOW UP PLAN:25073}  SIG***   Current Barriers:  . {PHARMCACYBARRIERS:21091514}  Pharmacist Clinical Goal(s):  Marland Kitchen Over the next *** days, patient will {PHARMACYGOALCHOICES:25079} through collaboration with PharmD and provider.   Interventions: . 1:1 collaboration with Carollee Herter, Alferd Apa, DO regarding development and update of comprehensive plan of care as evidenced by provider attestation and co-signature . Inter-disciplinary care team collaboration (see longitudinal plan of care) . Comprehensive medication review performed; medication list updated in electronic medical record  {PHARMACYDISEASECHOICES:21091512}  Patient  Goals/Self-Care Activities . Over the next *** days, patient will:  {PHARMACYPATIENTGOALS:25081}  Follow Up Plan: {CM FOLLOW UP PLAN:25073} ***

## 2020-11-19 ENCOUNTER — Telehealth: Payer: PPO

## 2020-11-26 ENCOUNTER — Other Ambulatory Visit: Payer: Self-pay | Admitting: Family Medicine

## 2020-11-26 DIAGNOSIS — I10 Essential (primary) hypertension: Secondary | ICD-10-CM

## 2020-11-27 ENCOUNTER — Other Ambulatory Visit: Payer: Self-pay | Admitting: Family Medicine

## 2020-11-27 DIAGNOSIS — I10 Essential (primary) hypertension: Secondary | ICD-10-CM

## 2020-11-29 ENCOUNTER — Telehealth: Payer: Self-pay | Admitting: Pharmacist

## 2020-11-29 NOTE — Progress Notes (Addendum)
    Chronic Care Management Pharmacy Assistant   Name: Shannon Austin  MRN: [id number] DOB: 1933-03-27  Reason for Encounter: Medication Review/Medication Coordination Call.  Medications: Outpatient Encounter Medications as of 11/29/2020  Medication Sig Note   ESTRACE VAGINAL 0.1 MG/GM vaginal cream Place 1 Applicatorful vaginally 3 (three) times a week.  06/12/2020: Uses infrequently   fish oil-omega-3 fatty acids 1000 MG capsule Take 1,200 mg by mouth daily.     ipratropium (ATROVENT) 0.06 % nasal spray Place 2 sprays into both nostrils 4 (four) times daily as needed for rhinitis. 10/09/2019: Taking once daily   losartan (COZAAR) 100 MG tablet TAKE ONE TABLET BY MOUTH EVERY MORNING. Pt needs OV for further refills    Multiple Vitamins-Minerals (PRESERVISION AREDS PO) Take 1 tablet by mouth daily.    MYRBETRIQ 50 MG TB24 tablet Take 1 tablet by mouth daily. 06/12/2020: Needs visit before refills will be given, but pt uncomfortable going in office   nadolol (CORGARD) 20 MG tablet TAKE ONE TABLET BY MOUTH EVERY MORNING    spironolactone (ALDACTONE) 25 MG tablet Take 1 tablet (25 mg total) by mouth every morning. Pt needs OV for further refills    No facility-administered encounter medications on file as of 11/29/2020.    Reviewed chart for medication changes ahead of medication coordination call.  No OVs, Consults, or hospital visits since last care coordination call.  No medication changes indicated.  BP Readings from Last 3 Encounters:  10/13/18 128/76  09/19/18 106/70  01/06/18 128/88    Lab Results  Component Value Date   HGBA1C 5.5 01/06/2018     Patient obtains medications through Adherence Packaging  90 Days   Last adherence delivery included: (90 DS on 09/10/20) Spironolactone 25 mg; one tab with Breakfast Losartan 100 mg; one tab with Breakfast Nadolol 20 mg; one tab with Breakfast Ipratropium 0.06% Nasal Spray  Patient declined meds last month: (90 DS on  09/10/20) Spironolactone 25 mg; one tab with Breakfast Losartan 100 mg; one tab with Breakfast Nadolol 20 mg; one tab with Breakfast Ipratropium 0.06% Nasal Spray  Patient is due for next adherence delivery on: 12/06/20.  Called patient and reviewed medications and coordinated delivery.  This delivery to include: Spironolactone 25 mg; one tab with Breakfast Losartan 100 mg; one tab with Breakfast Nadolol 20 mg; one tab with Breakfast Ipratropium 0.06% Nasal Spray  Patient declined the following medications: None  Patient needs refills for: Losartan 100 mg, Spironolactone 25 mg-requested  Confirmed delivery date of 12/06/20, advised patient that pharmacy will contact them the morning of delivery.  Follow-Up:Pharmacist Review  Charlann Lange, RMA Clinical Pharmacist Assistant [phone]  6 minutes spent in review, coordination, and documentation.  Reviewed by: Beverly Milch, PharmD Clinical Pharmacist [facility] [phone]

## 2020-12-02 ENCOUNTER — Other Ambulatory Visit: Payer: Self-pay

## 2020-12-02 DIAGNOSIS — I10 Essential (primary) hypertension: Secondary | ICD-10-CM

## 2020-12-02 MED ORDER — SPIRONOLACTONE 25 MG PO TABS: 25.0000 mg | ORAL_TABLET | Freq: Every morning | ORAL | 0 refills | Status: DC

## 2020-12-02 MED ORDER — LOSARTAN POTASSIUM 100 MG PO TABS: ORAL_TABLET | ORAL | 0 refills | Status: DC

## 2020-12-05 ENCOUNTER — Encounter: Payer: Self-pay | Admitting: Family Medicine

## 2020-12-05 ENCOUNTER — Ambulatory Visit (INDEPENDENT_AMBULATORY_CARE_PROVIDER_SITE_OTHER): Payer: PPO | Admitting: Family Medicine

## 2020-12-05 ENCOUNTER — Other Ambulatory Visit: Payer: Self-pay

## 2020-12-05 DIAGNOSIS — I10 Essential (primary) hypertension: Secondary | ICD-10-CM

## 2020-12-05 DIAGNOSIS — R739 Hyperglycemia, unspecified: Secondary | ICD-10-CM | POA: Diagnosis not present

## 2020-12-05 MED ORDER — SPIRONOLACTONE 25 MG PO TABS: 25.0000 mg | ORAL_TABLET | Freq: Every morning | ORAL | 1 refills | Status: DC

## 2020-12-05 MED ORDER — LOSARTAN POTASSIUM 100 MG PO TABS: ORAL_TABLET | ORAL | 1 refills | Status: DC

## 2020-12-05 NOTE — Assessment & Plan Note (Signed)
Check labs  Watch simple sugars and starches  

## 2020-12-05 NOTE — Patient Instructions (Signed)

## 2020-12-05 NOTE — Progress Notes (Signed)
Patient ID: Shannon Austin, female    DOB: [birth year]  Age: 87 y.o. MRN: [id number]    Subjective:  Subjective  HPI Shannon Austin presents for office visit today. She reports that she is dealing with financial stresses that have caused her stress that might have contributed to her high BP reading today.  BP Readings from Last 3 Encounters:  12/05/20 130/72  10/13/18 128/76  09/19/18 106/70  She denies any chest pain, SOB, fever, abdominal pain, cough, chills, sore throat, dysuria, urinary incontinence, back pain, HA, or N/VD.  She states that her ear canals are smaller than average and she periodically has to visit her ENT specialist to drain her ears out.    Review of Systems  Constitutional: Negative for chills, fatigue and fever.  HENT: Negative for congestion, rhinorrhea, sinus pressure, sinus pain and sore throat.   Eyes: Negative for pain.  Respiratory: Negative for cough and shortness of breath.   Cardiovascular: Negative for chest pain, palpitations and leg swelling.  Gastrointestinal: Negative for abdominal pain, blood in stool, diarrhea, nausea and vomiting.  Genitourinary: Negative for decreased urine volume, flank pain, frequency, vaginal bleeding and vaginal discharge.  Musculoskeletal: Negative for back pain.  Neurological: Negative for headaches.    History Past Medical History:  Diagnosis Date  . Gross hematuria   . Hyperlipidemia   . Hypertension   . Osteoporosis   . Supraventricular tachycardia ([place])   . Vaginitis    atropic    She has a past surgical history that includes Cesarean section; Dilation and curettage of uterus; fibrocystic mass; Appendectomy; Cataract extraction; Carpal tunnel release; and Cystoscopy (05/09/10, 09/11/10, 11/21/12, 01/01/15).   Her family history includes Diabetes in her maternal grandmother; Hypertension in her father and mother.She reports that she has quit smoking. She has never used smokeless tobacco. She reports current alcohol  use. She reports that she does not use drugs.  Current Outpatient Medications on File Prior to Visit  Medication Sig Dispense Refill  . ESTRACE VAGINAL 0.1 MG/GM vaginal cream Place 1 Applicatorful vaginally 3 (three) times a week.     . fish oil-omega-3 fatty acids 1000 MG capsule Take 1,200 mg by mouth daily.    Marland Kitchen ipratropium (ATROVENT) 0.06 % nasal spray Place 2 sprays into both nostrils 4 (four) times daily as needed for rhinitis.    . Multiple Vitamins-Minerals (PRESERVISION AREDS PO) Take 1 tablet by mouth daily.    . nadolol (CORGARD) 20 MG tablet TAKE ONE TABLET BY MOUTH EVERY MORNING 90 tablet 1   No current facility-administered medications on file prior to visit.     Objective:  Objective  Physical Exam Constitutional:      General: She is not in acute distress.    Appearance: Normal appearance. She is not ill-appearing or toxic-appearing.  HENT:     Head: Normocephalic and atraumatic.     Right Ear: Tympanic membrane, ear canal and external ear normal.     Left Ear: Tympanic membrane, ear canal and external ear normal.     Nose: No congestion or rhinorrhea.  Eyes:     Extraocular Movements: Extraocular movements intact.     Pupils: Pupils are equal, round, and reactive to light.  Cardiovascular:     Rate and Rhythm: Normal rate and regular rhythm.     Pulses: Normal pulses.     Heart sounds: Normal heart sounds. No murmur heard.   Pulmonary:     Effort: Pulmonary effort is normal. No respiratory distress.  Breath sounds: Normal breath sounds. No wheezing, rhonchi or rales.  Abdominal:     General: Bowel sounds are normal.     Palpations: Abdomen is soft. There is no mass.     Tenderness: There is no abdominal tenderness. There is no guarding.     Hernia: No hernia is present.  Musculoskeletal:        General: Normal range of motion.     Cervical back: Normal range of motion and neck supple.  Skin:    General: Skin is warm and dry.  Neurological:      Mental Status: She is alert and oriented to person, place, and time.  Psychiatric:        Behavior: Behavior normal.    BP 130/72   Pulse 86   Temp 98.5 F (36.9 C) (Oral)   Resp 18   Ht 4\' 10"  (1.473 m)   Wt 106 lb 9.6 oz (48.4 kg)   SpO2 97%   BMI 22.28 kg/m  Wt Readings from Last 3 Encounters:  12/05/20 106 lb 9.6 oz (48.4 kg)  10/13/18 129 lb 3.2 oz (58.6 kg)  09/19/18 127 lb 9.6 oz (57.9 kg)     Lab Results  Component Value Date   WBC 8.2 09/19/2018   HGB 14.4 09/19/2018   HCT 43.0 09/19/2018   PLT 247.0 09/19/2018   GLUCOSE 109 (H) 10/24/2018   CHOL 205 (H) 09/19/2018   TRIG 91.0 09/19/2018   HDL 64.10 09/19/2018   LDLDIRECT 125.8 05/03/2013   LDLCALC 123 (H) 09/19/2018   ALT 15 10/24/2018   AST 15 10/24/2018   NA 139 10/24/2018   K 4.5 10/24/2018   CL 101 10/24/2018   CREATININE 0.86 10/24/2018   BUN 25 (H) 10/24/2018   CO2 32 10/24/2018   TSH 1.57 10/31/2014   INR 1.0 07/19/2008   HGBA1C 5.5 01/06/2018    DG Lumbar Spine 2-3 Views  Result Date: 10/13/2018 CLINICAL DATA:  Right groin area pain in right hip pain for 3 weeks. No known injury. EXAM: LUMBAR SPINE - 2-3 VIEW COMPARISON:  [facility], 12/30/2017. FINDINGS: No fracture.  No bone lesion. Grade 1 anterolisthesis of L5 on S1. Minimal retrolisthesis of L2 on L3. Mild-to-moderate levoscoliosis, apex at L2-L3. Moderate to marked loss of disc height at T11-T12 and T12-L1. Mild loss of disc height at L1-L2. Moderate loss of disc height at L2-L3-L3-L4. Marked loss of disc height at L4-L5. There are facet degenerative changes which are most evident at L5-S1. Bones are diffusely demineralized. Dense calcification is noted along a normal caliber abdominal aorta. There are calcifications in the pelvis consistent with calcified fibroids. IMPRESSION: 1. No fracture or acute finding. 2. Degenerative changes as detailed above. Levoscoliosis. Diffuse skeletal demineralization. Electronically Signed   By: Lajean Manes M.D.    On: 10/13/2018 16:45   DG Hip Unilat W OR W/O Pelvis 2-3 Views Right  Result Date: 10/13/2018 CLINICAL DATA:  Right hip and groin pain for 3 weeks. EXAM: DG HIP (WITH OR WITHOUT PELVIS) 2-3V RIGHT COMPARISON:  Abdominal radiograph 12/30/2017 FINDINGS: Severe right hip osteoarthrosis is again seen with complete joint space loss superiorly, subchondral sclerosis, and subchondral cyst formation. Only mild degenerative changes are present at the left hip. No acute fracture or dislocation is identified. Lumbar disc degeneration is reported separately. Coarse calcifications in the pelvis correspond to uterine fibroids. IMPRESSION: Severe right hip osteoarthrosis without acute osseous abnormality. Electronically Signed   By: Logan Bores M.D.   On: 10/13/2018 16:46  Assessment & Plan:  Plan    Meds ordered this encounter  Medications  . losartan (COZAAR) 100 MG tablet    Sig: TAKE ONE TABLET BY MOUTH EVERY MORNING.    Dispense:  90 tablet    Refill:  1  . spironolactone (ALDACTONE) 25 MG tablet    Sig: Take 1 tablet (25 mg total) by mouth every morning.    Dispense:  90 tablet    Refill:  1    This prescription was filled on 09/07/2020. Any refills authorized will be placed on file.    Problem List Items Addressed This Visit      Unprioritized   HYPERTENSION, BENIGN    Well controlled, no changes to meds. Encouraged heart healthy diet such as the DASH diet and exercise as tolerated.  Repeat bp 130/78       Relevant Medications   losartan (COZAAR) 100 MG tablet   spironolactone (ALDACTONE) 25 MG tablet    Other Visit Diagnoses    Essential hypertension       Relevant Medications   losartan (COZAAR) 100 MG tablet   spironolactone (ALDACTONE) 25 MG tablet   Hyperglycemia          Follow-up: Return in about 6 months (around 06/06/2021), or if symptoms worsen or fail to improve, for annual exam, fasting.   I,David Hanna,acting as a Education administrator for [employer], DO.,have  documented all relevant documentation on the behalf of Ann Held, DO,as directed by  Ann Held, DO while in the presence of [place], DO, have reviewed all documentation for this visit. The documentation on 12/05/20 for the exam, diagnosis, procedures, and orders are all accurate and complete.

## 2020-12-05 NOTE — Assessment & Plan Note (Signed)
Well controlled, no changes to meds. Encouraged heart healthy diet such as the DASH diet and exercise as tolerated.  Repeat bp 130/78

## 2020-12-05 NOTE — Addendum Note (Signed)
Addended by: Kelle Darting A on: 12/05/2020 02:53 PM   Modules accepted: Orders

## 2020-12-06 LAB — COMPREHENSIVE METABOLIC PANEL
ALT: 14 U/L (ref 0–35)
AST: 17 U/L (ref 0–37)
Albumin: 4.3 g/dL (ref 3.5–5.2)
Alkaline Phosphatase: 83 U/L (ref 39–117)
BUN: 27 mg/dL — ABNORMAL HIGH (ref 6–23)
CO2: 27 mEq/L (ref 19–32)
Calcium: 10 mg/dL (ref 8.4–10.5)
Chloride: 99 mEq/L (ref 96–112)
Creatinine, Ser: 0.85 mg/dL (ref 0.40–1.20)
GFR: 62.34 mL/min (ref >60.00)
Glucose, Bld: 96 mg/dL (ref 70–99)
Potassium: 4.9 mEq/L (ref 3.5–5.1)
Sodium: 135 mEq/L (ref 135–145)
Total Bilirubin: 0.6 mg/dL (ref 0.2–1.2)
Total Protein: 6.8 g/dL (ref 6.0–8.3)

## 2020-12-06 LAB — HEMOGLOBIN A1C: Hgb A1c MFr Bld: 5.8 % (ref 4.6–6.5)

## 2020-12-06 LAB — LIPID PANEL
Cholesterol: 186 mg/dL (ref 0–200)
HDL: 67.6 mg/dL (ref >39.00)
LDL Cholesterol: 103 mg/dL — ABNORMAL HIGH (ref 0–99)
NonHDL: 118.35
Total CHOL/HDL Ratio: 3
Triglycerides: 79 mg/dL (ref 0.0–149.0)
VLDL: 15.8 mg/dL (ref 0.0–40.0)

## 2020-12-13 ENCOUNTER — Ambulatory Visit (INDEPENDENT_AMBULATORY_CARE_PROVIDER_SITE_OTHER): Payer: PPO | Admitting: Pharmacist

## 2020-12-13 DIAGNOSIS — I10 Essential (primary) hypertension: Secondary | ICD-10-CM | POA: Diagnosis not present

## 2020-12-13 DIAGNOSIS — M810 Age-related osteoporosis without current pathological fracture: Secondary | ICD-10-CM | POA: Diagnosis not present

## 2020-12-13 DIAGNOSIS — R739 Hyperglycemia, unspecified: Secondary | ICD-10-CM

## 2020-12-13 DIAGNOSIS — E785 Hyperlipidemia, unspecified: Secondary | ICD-10-CM | POA: Diagnosis not present

## 2020-12-13 NOTE — Chronic Care Management (AMB) (Signed)
Chronic Care Management Pharmacy Note  12/13/2020 Name:  Shannon Austin MRN:  [id number] DOB:  [birth year]  Subjective: Shannon Austin is an 87 y.o. year old female who is a primary patient of Ann Held, DO.  The CCM team was consulted for assistance with disease management and care coordination needs.    Engaged with patient by telephone for follow up visit in response to provider referral for pharmacy case management and/or care coordination services.   Consent to Services:  The patient was given information about Chronic Care Management services, agreed to services, and gave verbal consent prior to initiation of services.  Please see initial visit note for detailed documentation.   Patient Care Team: Carollee Herter, Alferd Apa, DO as PCP - General (Family Medicine) Puschinsky, Fransico Him., MD (General Surgery) Maggie Schwalbe., MD as Consulting Physician (Otolaryngology) Deirdre Pippins, PA-C as Physician Assistant (Internal Medicine) Odis Luster (Dentistry) Cherre Robins, PharmD (Pharmacist)  Recent office visits: 12/05/2020 - PCP (Dr Etter Sjogren) f/u chronic conditions; Pt. reports that she is dealing with financial stresses that might have contributed to her high BP reading today. No medication changes except Myrbetriq removed from med list since patient was no longer taking  Recent consult visits: None recently   Hospital visits: None in previous 6 months  Objective:  Lab Results  Component Value Date   CREATININE 0.85 12/05/2020   CREATININE 0.86 10/24/2018   CREATININE 1.01 09/19/2018    Lab Results  Component Value Date   HGBA1C 5.8 12/05/2020   Last diabetic Eye exam: No results found for: HMDIABEYEEXA  Last diabetic Foot exam: No results found for: HMDIABFOOTEX      Component Value Date/Time   CHOL 186 12/05/2020 1454   TRIG 79.0 12/05/2020 1454   HDL 67.60 12/05/2020 1454   CHOLHDL 3 12/05/2020 1454   VLDL 15.8 12/05/2020 1454   LDLCALC 103 (H)  12/05/2020 1454   LDLDIRECT 125.8 05/03/2013 1037    Hepatic Function Latest Ref Rng & Units 12/05/2020 10/24/2018 09/19/2018  Total Protein 6.0 - 8.3 g/dL 6.8 6.9 7.2  Albumin 3.5 - 5.2 g/dL 4.3 4.1 4.3  AST 0 - 37 U/L _0 ALT 0 - 35 U/L _1 Alk Phosphatase 39 - 117 U/L 83 74 76  Total Bilirubin 0.2 - 1.2 mg/dL 0.6 0.6 0.5  Bilirubin, Direct 0.0 - 0.3 mg/dL - - -    Lab Results  Component Value Date/Time   TSH 1.57 10/31/2014 10:43 AM   TSH 1.32 05/03/2013 10:37 AM    CBC Latest Ref Rng & Units 09/19/2018 05/04/2017 04/02/2016  WBC 4.0 - 10.5 K/uL 8.2 6.4 7.5  Hemoglobin 12.0 - 15.0 g/dL 14.4 15.1(H) 14.2  Hematocrit 36.0 - 46.0 % 43.0 45.8 42.9  Platelets 150.0 - 400.0 K/uL 247.0 236.0 249.0    Lab Results  Component Value Date/Time   VD25OH 39.42 10/31/2014 10:43 AM   VD25OH 52 04/02/2010 08:34 PM   VD25OH 37 11/26/2008 08:17 PM    Clinical ASCVD: No  The ASCVD Risk score Mikey Bussing DC Jr., et al., 2013) failed to calculate for the following reasons:   The 2013 ASCVD risk score is only valid for ages 38 to 32    Other: See below for DEXA info  Social History   Tobacco Use  Smoking Status Former Smoker  Smokeless Tobacco Never Used   BP Readings from Last 3 Encounters:  12/05/20 130/72  10/13/18 128/76  09/19/18  106/70   Pulse Readings from Last 3 Encounters:  12/05/20 86  10/13/18 70  09/19/18 71   Wt Readings from Last 3 Encounters:  12/05/20 106 lb 9.6 oz (48.4 kg)  10/13/18 129 lb 3.2 oz (58.6 kg)  09/19/18 127 lb 9.6 oz (57.9 kg)    Assessment: Review of patient past medical history, allergies, medications, health status, including review of consultants reports, laboratory and other test data, was performed as part of comprehensive evaluation and provision of chronic care management services.   SDOH:  (Social Determinants of Health) assessments and interventions performed:  SDOH Interventions   Flowsheet Row Most Recent Value  SDOH Interventions    Financial Strain Interventions Intervention Not Indicated  Physical Activity Interventions Other (Comments)      CCM Care Plan  Allergies  Allergen Reactions  . Latex Rash  . Lisinopril Cough  . Amlodipine Besylate   . Ezetimibe   . Hydrochlorothiazide   . Statins     Muscle weakness  . Sulfonamide Derivatives Rash and Other (See Comments)    HA    Medications Reviewed Today    Reviewed by Cherre Robins, PharmD (Pharmacist) on 12/13/20 at 0909  Med List Status: <None>  Medication Order Taking? Sig Documenting Provider Last Dose Status Informant  ESTRACE VAGINAL 0.1 MG/GM vaginal cream [id number] Yes Place 1 Applicatorful vaginally 3 (three) times a week.  [provider] Taking Active            Med Note Vivien Presto Jun 12, 2020  5:18 PM) Uses infrequently  fish oil-omega-3 fatty acids 1000 MG capsule [id number] Yes Take 1,200 mg by mouth daily. [provider] Taking Active   ipratropium (ATROVENT) 0.06 % nasal spray [id number] Yes Place 2 sprays into both nostrils 4 (four) times daily as needed for rhinitis. [provider] Taking Active            Med Note Quinn Axe Oct 09, 2019  2:00 PM) Taking once daily  losartan (COZAAR) 100 MG tablet [id number] Yes TAKE ONE TABLET BY MOUTH EVERY MORNING. Ann Held, DO Taking Active   Multiple Vitamins-Minerals (PRESERVISION AREDS PO) [id number] Yes Take 1 tablet by mouth daily. [provider] Taking Active   nadolol (CORGARD) 20 MG tablet [id number] Yes TAKE ONE TABLET BY MOUTH EVERY MORNING Ann Held, DO Taking Active   spironolactone (ALDACTONE) 25 MG tablet [id number] Yes Take 1 tablet (25 mg total) by mouth every morning. Ann Held, DO Taking Active           Patient Active Problem List   Diagnosis Date Noted  . Hyperglycemia 12/05/2020  . Spinal stenosis of lumbar region 05/08/2015  . Atypical nevus 10/31/2014  . General medical examination  01/07/2012  . BPPV (benign paroxysmal positional vertigo) 10/16/2011  . DYSURIA 05/09/2009  . Hematuria 10/12/2008  . VAGINITIS, ATROPHIC, POSTMENOPAUSAL 10/12/2008  . Osteoporosis 10/12/2008  . Hyperlipidemia 09/25/2008  . HYPERTENSION, BENIGN 09/25/2008  . SVT/ PSVT/ PAT 09/25/2008    Immunization History  Administered Date(s) Administered  . Influenza Split 05/10/2013  . Influenza Whole 06/14/2009, 06/23/2010  . Influenza, High Dose Seasonal PF 05/04/2017, 05/21/2018  . Influenza,inj,Quad PF,6+ Mos 06/01/2014, 05/08/2015  . Moderna Sars-Covid-2 Vaccination 01/07/2020, 02/10/2020  . Pneumococcal Conjugate-13 10/31/2014  . Pneumococcal Polysaccharide-23 04/02/2010  . Zoster 08/31/2012    Conditions to be addressed/monitored: HTN, HLD and SVT; osteoporosis; BPPC; allergic rhinitis  Care Plan : General Pharmacy (Adult)  Updates made by Cherre Robins, PHARMD since 12/13/2020 12:00 AM    Problem: Chronic Disease Management support, education, and care coordination needs related to HTN, SVT, BPPV, Osteoporosis, HLD, Allergic rhinitis,   Priority: Medium  Onset Date: 12/13/2020  Note:   Current Barriers:  . Chronic Disease Management support, education, and care coordination needs related to HTN, SVT, BPPV, Osteoporosis, HLD, Atrophic vaginitis, Allergic rhinitis, Overactive Bladder  Pharmacist Clinical Goal(s):  Marland Kitchen Over the next 180 days, patient will achieve adherence to monitoring guidelines and medication adherence to achieve therapeutic efficacy . adhere to prescribed medication regimen as evidenced by filling medications on time and using packaging system with [facility] . contact provider office for questions/concerns as evidenced notation of same in electronic health record through collaboration with PharmD and provider.   Interventions: . 1:1 collaboration with Carollee Herter, Alferd Apa, DO regarding development and update of comprehensive plan of care as evidenced by  provider attestation and co-signature . Inter-disciplinary care team collaboration (see longitudinal plan of care) . Comprehensive medication review performed; medication list updated in electronic medical record  Hyperlipidemia / Pre Diabetes: . Controlled - Last A1c was 5.8%  current treatment: diet and exercise . Current glucose readings: does not check BG at home . Denies hypoglycemic/hyperglycemic symptoms . Current exercise: walking and stretches about 3 times per week . Recommended continue current diet; Goal of physical activity is 150 minutes per week  Hypertension: . Controlled (initial BP at last OV was elevated but identified that this was likely due to financial stress from tax bill) .       Current treatment: o losartan (COZAAR) 100 MG qam o nadolol (CORGARD) 20 MG qam o spironolactone (ALDACTONE) 25 MG qam . Current home readings: does not check BP at home . Educated on affects of stress on BP and heart; Recommended continue current antihypertensive medications  Hyperlipidemia: . Uncontrolled but LDL did improve from 123 to 103 (11/2020) - LDL goal <100 .       current treatment:  o Omega 3 FA 1272m daily  o Diet and exercise . Medications previously tried: simvastatin - caused myalgias; ezetimibe - did not tolerate but pt could not recall why  . Recommended continue to follow low fat diet; exercise goal 150 minutes per week  Osteoporosis: . Current treatment: calcium + vitamin D (supplied by multivitamin and diet) . Last DEXA: 2016, t score -2.9 (left femur) . Has taken alendronate in past; Had been recommended to restart after DEXA in 2016 but patient declined.  .       Recommended recheck DEXA but patient declined; Discuss getting 12073m/ day of calcium and 1000 units per day of vitamin D; Reviewed fall prevention strategies.  Health Maintenance  . Interventions: o Recommended yearly flu vaccine  o Consider completion of Shingrix vaccine series  Medication  managemen . Current pharmacy:  [facility] - using packaging system; last deliver 12/06/2020 . Interventions o Comprehensive medication review performed. Medication and allergy list updated o Utilize [facility] pharmacy for medication synchronization, packaging and delivery - will check to see if last delivery was for 30 day supply or 90 day supply (patient wants 90 day supply but I think she only received 1 box which is 30 DS0 . Patient self care activities - Over the next 180 days, patient will: o Focus on medication adherence by filling and taking medications appropriately  o Take medications as prescribed o Report any questions or concerns to PharmD and/or provider(s)  Patient Goals/Self-Care  Activities . Over the next 180 days, patient will:  take medications as prescribed, target a minimum of 150 minutes of moderate intensity exercise weekly, and practice fall prevention  Follow Up Plan: Telephone follow up appointment with care management team member scheduled for:  6 months      Medication Assistance: None required.  Patient affirms current coverage meets needs.  Patient's preferred pharmacy is:  [facility] - [place], [place] - [address] [place] [address] Phone: [phone] Fax: [phone]  Uses pill box? No - using [facility] packing  Pt endorses 100% compliance  Follow Up:  Patient agrees to Care Plan and Follow-up.  Plan: Message sent to [facility] to see if last delivery was 30 or 90 day supply. Patient was expecting 14 DS and this is what she prefers if [facility] is able to do.    Telephone follow up appointment with care management team member scheduled for:  6 months  Cherre Robins, PharmD Clinical Pharmacist [place] [facility] [facility]

## 2020-12-13 NOTE — Patient Instructions (Signed)
Visit Information Shannon Austin,  It was a pleasure speaking with you today. Please feel free to contact me if you have any questions or concerns. Below is information regarding you health goals.  I have also included information about the Chronic Care Management Program.  Keep up the good work!  Cherre Robins, PharmD Clinical Pharmacist [facility] [place] [facility] [phone]    PATIENT GOALS: Goals Addressed            This Visit's Progress   . Chronic Care Management Pharmacy Care Plan       CARE PLAN ENTRY (see longitudinal plan of care for additional care plan information)  Current Barriers:  . Chronic Disease Management support, education, and care coordination needs related to high blood pressure, irregular heart rate; vertigo, Osteoporosis, elevated LDL, Atrophic vaginitis, Allergic rhinitis, Overactive Bladder   Hypertension BP Readings from Last 3 Encounters:  12/05/20 130/72  10/13/18 128/76  09/19/18 106/70   . Pharmacist Clinical Goal(s): o Over the next 180 days, patient will work with PharmD and providers to maintain BP goal <140/90 . Current regimen:   Losartan 118m daily  Spironolactone 254mdaily  Nadolol 2052maily . Patient self care activities - Over the next 180 days, patient will: o Maintain hypertension medication regimen.   Hyperlipidemia Lab Results  Component Value Date/Time   LDLCALC 103 (H) 12/05/2020 02:54 PM   LDLDIRECT 125.8 05/03/2013 10:37 AM   . Pharmacist Clinical Goal(s): o Over the next 180 days, patient will work with PharmD and providers to achieve LDL goal < 100 . Current regimen:  o Diet and exercise management   . Interventions: o Discussed LDL goal - LDL is improved . Patient self care activities - Over the next 180 days, patient will: o Consider decreasing consumption of cholesterol containing foods  Osteoporosis . Pharmacist Clinical Goal(s) o Over the next 180 days, patient will work with  PharmD and providers to reduce risk of fracture due to osteoporosis . Current regimen:  o None . Interventions: o Discussed fall prevention strategies . Patient self care activities - Over the next 180 days, patient will: o Consider intake of 1200m5m calcium daily through diet and/or supplementation o Consider intake of [phone] units of vitamin D through supplementation  Health Maintenance  . Pharmacist Clinical Goal(s) o Over the next 180 days, patient will work with PharmD and providers to complete health maintenance screenings/vaccinations . Interventions: o Recommended yearly flu vaccine  o Consider completion of Shingrix vaccine series . Patient self care activities - Over the next 180 days, patient will: o Receive flu vaccine in Fall 2022  Medication management . Pharmacist Clinical Goal(s): o Over the next 180 days, patient will work with PharmD and providers to maintain optimal medication adherence . Current pharmacy: [facility] switched to [facility] . Interventions o Comprehensive medication review performed. o Utilize [facility] pharmacy for medication synchronization, packaging and delivery - will check to see if last delivery was for 30 day supply or 90 day supply . Patient self care activities - Over the next 180 days, patient will: o Focus on medication adherence by filling and taking medications appropriately  o Take medications as prescribed o Report any questions or concerns to PharmD and/or provider(s)  Please see past updates related to this goal by clicking on the "Past Updates" button in the selected goal        Date of initial CCM visit with KaneMelvenia Beam, Pharm D: 10/09/2019  Information about Chronic Care  Management (CCM)  services:  1. CCM service includes personalized support from designated clinical staff supervised by the physician, including individualized plan of care and coordination with other care providers 2. 24/7 contact phone numbers for  assistance for urgent and routine care needs. 3. Service will only be billed when office clinical staff spend 20 minutes or more in a month to coordinate care. 4. Only one practitioner may furnish and bill the service in a calendar month. 5. The patient may stop CCM services at amy time (effective at the end of the month) by phone call to the office staff. 6. The patient will be responsible for cost sharing (co-pay) or up to 20% of the service fee (after annual deductible is met)   Follow up plan:   Telephone follow up appointment with care management team member scheduled for:  3 to 6 months  The patient verbalized understanding of instructions, educational materials, and care plan provided today and agreed to receive a mailed copy of patient instructions, educational materials, and care plan.

## 2020-12-18 NOTE — Chronic Care Management (AMB) (Signed)
Received the following message from [place] [facility] [place] regarding patient's prescriptions:  Hendricks, Kathryne Hitch, CMA  Cherre Robins, PharmD [place] Border,  I took over this task for [employer]. I have reviewed this patients [facility] and it seems that the dispensing form had an error. The form states 90 DS on the top of the form and then 30DS on the bottom of the form. The pharmacy filled 30DS. I contacted Charlotte Crumb about this and he stated at this point we will just continue the 90DS next month at her next fill.  Thurmond Butts, CMA

## 2021-03-28 ENCOUNTER — Other Ambulatory Visit: Payer: Self-pay | Admitting: Family Medicine

## 2021-03-28 DIAGNOSIS — I10 Essential (primary) hypertension: Secondary | ICD-10-CM

## 2021-04-14 ENCOUNTER — Telehealth: Payer: PPO

## 2021-05-21 ENCOUNTER — Telehealth: Payer: Self-pay | Admitting: Pharmacist

## 2021-05-21 NOTE — Chronic Care Management (AMB) (Signed)
    Chronic Care Management Pharmacy Assistant   Name: Shannon Austin  MRN: [id number] DOB: 1933/02/14  Reason for Encounter: Disease State  Recent office visits:  None noted  Recent consult visits:  None noted  Hospital visits:  None in previous 6 months  Medications: Outpatient Encounter Medications as of 05/21/2021  Medication Sig Note   Apoaequorin (PREVAGEN PO) Take 1 tablet by mouth daily.    ESTRACE VAGINAL 0.1 MG/GM vaginal cream Place 1 Applicatorful vaginally 3 (three) times a week.  06/12/2020: Uses infrequently   fish oil-omega-3 fatty acids 1000 MG capsule Take 1,200 mg by mouth daily.    ipratropium (ATROVENT) 0.06 % nasal spray Place 2 sprays into both nostrils 4 (four) times daily as needed for rhinitis. 10/09/2019: Taking once daily   losartan (COZAAR) 100 MG tablet TAKE ONE TABLET BY MOUTH EVERY MORNING.    Multiple Vitamins-Minerals (PRESERVISION AREDS PO) Take 1 tablet by mouth daily.    nadolol (CORGARD) 20 MG tablet TAKE ONE TABLET BY MOUTH EVERY MORNING    spironolactone (ALDACTONE) 25 MG tablet Take 1 tablet (25 mg total) by mouth every morning.    No facility-administered encounter medications on file as of 05/21/2021.   Have you had any problems recently with your health? Patient states she has no problems with her health at this time.  Have you had any problems with your pharmacy? Patient states she has no problems with her pharmacy.  What issues or side effects are you having with your medications? Patient states she has no issues or side effects with her medications.  What would you like me to pass along to [place] for them to help you with?  Patient states there is nothing at this time.  What can we do to take care of you better? Patient states there is nothing at this time.  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  Star Rating Drugs: Losartan 100 mg Last filled:04/04/21 90 DS  Myriam Elta [place], [place]

## 2021-06-23 ENCOUNTER — Other Ambulatory Visit: Payer: Self-pay | Admitting: Family Medicine

## 2021-06-23 DIAGNOSIS — I10 Essential (primary) hypertension: Secondary | ICD-10-CM

## 2021-09-30 ENCOUNTER — Other Ambulatory Visit: Payer: Self-pay | Admitting: Family Medicine

## 2021-09-30 DIAGNOSIS — I10 Essential (primary) hypertension: Secondary | ICD-10-CM

## 2021-10-01 ENCOUNTER — Other Ambulatory Visit: Payer: Self-pay | Admitting: Family Medicine

## 2021-10-01 DIAGNOSIS — I10 Essential (primary) hypertension: Secondary | ICD-10-CM

## 2021-10-02 ENCOUNTER — Other Ambulatory Visit: Payer: Self-pay | Admitting: Family Medicine

## 2021-10-02 ENCOUNTER — Telehealth: Payer: Self-pay | Admitting: Family Medicine

## 2021-10-02 DIAGNOSIS — I10 Essential (primary) hypertension: Secondary | ICD-10-CM

## 2021-10-02 NOTE — Telephone Encounter (Signed)
Refill sent.

## 2021-10-02 NOTE — Telephone Encounter (Signed)
Attempted to contact pt to get her scheduled. There was no answer and phone kept on ringing.

## 2021-10-02 NOTE — Telephone Encounter (Signed)
Medication:  nadolol (CORGARD) 20 MG tablet [[id number]]    Has the patient contacted their pharmacy? No. (If no, request that the patient contact the pharmacy for the refill.) (If yes, when and what did the pharmacy advise?)     Preferred Pharmacy (with phone number or street name):  [facility] - [place], [place] - [place] [facility] [address]  [address], [place] [address]  Phone:  [phone]  Fax:  [phone]     Agent: Please be advised that RX refills may take up to 3 business days. We ask that you follow-up with your pharmacy.

## 2021-11-26 DIAGNOSIS — H6123 Impacted cerumen, bilateral: Secondary | ICD-10-CM | POA: Diagnosis not present

## 2021-12-09 ENCOUNTER — Ambulatory Visit: Payer: PPO | Admitting: Family Medicine

## 2021-12-09 ENCOUNTER — Encounter: Payer: Self-pay | Admitting: Family Medicine

## 2021-12-09 DIAGNOSIS — I10 Essential (primary) hypertension: Secondary | ICD-10-CM | POA: Diagnosis not present

## 2021-12-09 LAB — CBC WITH DIFFERENTIAL/PLATELET
Basophils Absolute: 0.1 10*3/uL (ref 0.0–0.1)
Basophils Relative: 0.9 % (ref 0.0–3.0)
Eosinophils Absolute: 0.1 10*3/uL (ref 0.0–0.7)
Eosinophils Relative: 1.9 % (ref 0.0–5.0)
HCT: 38.8 % (ref 36.0–46.0)
Hemoglobin: 12.9 g/dL (ref 12.0–15.0)
Lymphocytes Relative: 18.8 % (ref 12.0–46.0)
Lymphs Abs: 1.4 10*3/uL (ref 0.7–4.0)
MCHC: 33.2 g/dL (ref 30.0–36.0)
MCV: 99.6 fl (ref 78.0–100.0)
Monocytes Absolute: 0.8 10*3/uL (ref 0.1–1.0)
Monocytes Relative: 10.4 % (ref 3.0–12.0)
Neutro Abs: 5.1 10*3/uL (ref 1.4–7.7)
Neutrophils Relative %: 68 % (ref 43.0–77.0)
Platelets: 267 10*3/uL (ref 150.0–400.0)
RBC: 3.9 Mil/uL (ref 3.87–5.11)
RDW: 14.1 % (ref 11.5–15.5)
WBC: 7.5 10*3/uL (ref 4.0–10.5)

## 2021-12-09 LAB — COMPREHENSIVE METABOLIC PANEL
ALT: 13 U/L (ref 0–35)
AST: 17 U/L (ref 0–37)
Albumin: 4.1 g/dL (ref 3.5–5.2)
Alkaline Phosphatase: 95 U/L (ref 39–117)
BUN: 27 mg/dL — ABNORMAL HIGH (ref 6–23)
CO2: 27 mEq/L (ref 19–32)
Calcium: 9.7 mg/dL (ref 8.4–10.5)
Chloride: 103 mEq/L (ref 96–112)
Creatinine, Ser: 0.68 mg/dL (ref 0.40–1.20)
GFR: 78.69 mL/min (ref >60.00)
Glucose, Bld: 103 mg/dL — ABNORMAL HIGH (ref 70–99)
Potassium: 4.4 mEq/L (ref 3.5–5.1)
Sodium: 138 mEq/L (ref 135–145)
Total Bilirubin: 0.6 mg/dL (ref 0.2–1.2)
Total Protein: 6.5 g/dL (ref 6.0–8.3)

## 2021-12-09 LAB — LIPID PANEL
Cholesterol: 170 mg/dL (ref 0–200)
HDL: 65.6 mg/dL (ref >39.00)
LDL Cholesterol: 91 mg/dL (ref 0–99)
NonHDL: 104.69
Total CHOL/HDL Ratio: 3
Triglycerides: 68 mg/dL (ref 0.0–149.0)
VLDL: 13.6 mg/dL (ref 0.0–40.0)

## 2021-12-09 MED ORDER — NADOLOL 20 MG PO TABS: 20.0000 mg | ORAL_TABLET | Freq: Every morning | ORAL | 3 refills | Status: DC

## 2021-12-09 MED ORDER — SPIRONOLACTONE 25 MG PO TABS: 25.0000 mg | ORAL_TABLET | Freq: Every morning | ORAL | 3 refills | Status: DC

## 2021-12-09 MED ORDER — LOSARTAN POTASSIUM 100 MG PO TABS: 100.0000 mg | ORAL_TABLET | Freq: Every morning | ORAL | 3 refills | Status: DC

## 2021-12-09 NOTE — Progress Notes (Signed)
? ?Established Patient Office Visit ? ?Subjective:  ?Patient ID: Shannon Austin, female    DOB: 1932-12-30  Age: 88 y.o. MRN: [id number] ? ?CC:  ?Chief Complaint  ?Patient presents with  ? Hypertension  ? Follow-up  ? ? ?HPI ?Shannon Austin presents for f/u bp and labs.  No complaints  ? ?Past Medical History:  ?Diagnosis Date  ? Gross hematuria   ? Hyperlipidemia   ? Hypertension   ? Osteoporosis   ? Supraventricular tachycardia ([place])   ? Vaginitis   ? atropic  ? ? ?Past Surgical History:  ?Procedure Laterality Date  ? APPENDECTOMY    ? CARPAL TUNNEL RELEASE    ? right  ? CATARACT EXTRACTION    ? left eye  ? CESAREAN SECTION    ? CYSTOSCOPY  05/09/10, 09/11/10, 11/21/12, 01/01/15  ? DILATION AND CURETTAGE OF UTERUS    ? fibrocystic mass    ? right  ? ? ?Family History  ?Problem Relation Age of Onset  ? Hypertension Mother   ? Hypertension Father   ? Diabetes Maternal Grandmother   ? ? ?Social History  ? ?Socioeconomic History  ? Marital status: Single  ?  Spouse name: Not on file  ? Number of children: Not on file  ? Years of education: Not on file  ? Highest education level: Not on file  ?Occupational History  ? Occupation: nursing- retired  ?Tobacco Use  ? Smoking status: Former  ? Smokeless tobacco: Never  ?Substance and Sexual Activity  ? Alcohol use: Yes  ?  Comment: rare  ? Drug use: No  ? Sexual activity: Not Currently  ?Other Topics Concern  ? Not on file  ?Social History Narrative  ? Widower (2000)  ? ?Social Determinants of Health  ? ?Financial Resource Strain: Low Risk   ? Difficulty of Paying Living Expenses: Not hard at all  ?Food Insecurity: Not on file  ?Transportation Needs: Not on file  ?Physical Activity: Insufficiently Active  ? Days of Exercise per Week: 3 days  ? Minutes of Exercise per Session: 20 min  ?Stress: Not on file  ?Social Connections: Not on file  ?Intimate Partner Violence: Not on file  ? ? ?Outpatient Medications Prior to Visit  ?Medication Sig Dispense Refill  ? Apoaequorin (PREVAGEN PO)  Take 1 tablet by mouth daily.    ? ESTRACE VAGINAL 0.1 MG/GM vaginal cream Place 1 Applicatorful vaginally 3 (three) times a week.     ? fish oil-omega-3 fatty acids 1000 MG capsule Take 1,200 mg by mouth daily.    ? ipratropium (ATROVENT) 0.06 % nasal spray Place 2 sprays into both nostrils 4 (four) times daily as needed for rhinitis.    ? Multiple Vitamins-Minerals (PRESERVISION AREDS PO) Take 1 tablet by mouth daily.    ? losartan (COZAAR) 100 MG tablet TAKE ONE TABLET BY MOUTH EVERY MORNING 90 tablet 1  ? nadolol (CORGARD) 20 MG tablet TAKE ONE TABLET BY MOUTH EVERY MORNING 90 tablet 1  ? spironolactone (ALDACTONE) 25 MG tablet TAKE ONE TABLET BY MOUTH EVERY MORNING 90 tablet 1  ? ?No facility-administered medications prior to visit.  ? ? ?Allergies  ?Allergen Reactions  ? Latex Rash  ? Lisinopril Cough  ? Amlodipine Besylate   ? Ezetimibe   ? Hydrochlorothiazide   ? Statins   ?  Muscle weakness  ? Sulfonamide Derivatives Rash and Other (See Comments)  ?  HA  ? ? ?ROS ?Review of Systems  ?Constitutional:  Negative for  appetite change, diaphoresis, fatigue and unexpected weight change.  ?Eyes:  Negative for pain, redness and visual disturbance.  ?Respiratory:  Negative for cough, chest tightness, shortness of breath and wheezing.   ?Cardiovascular:  Negative for chest pain, palpitations and leg swelling.  ?Endocrine: Negative for cold intolerance, heat intolerance, polydipsia, polyphagia and polyuria.  ?Genitourinary:  Negative for difficulty urinating, dysuria and frequency.  ?Neurological:  Negative for dizziness, light-headedness, numbness and headaches.  ? ?  ?Objective:  ?  ?Physical Exam ?Vitals and nursing note reviewed.  ?Constitutional:   ?   Appearance: She is well-developed.  ?HENT:  ?   Head: Normocephalic and atraumatic.  ?Eyes:  ?   Conjunctiva/sclera: Conjunctivae normal.  ?Neck:  ?   Thyroid: No thyromegaly.  ?   Vascular: No carotid bruit or JVD.  ?Cardiovascular:  ?   Rate and Rhythm: Normal  rate and regular rhythm.  ?   Heart sounds: Normal heart sounds. No murmur heard. ?Pulmonary:  ?   Effort: Pulmonary effort is normal. No respiratory distress.  ?   Breath sounds: Normal breath sounds. No wheezing or rales.  ?Chest:  ?   Chest wall: No tenderness.  ?Musculoskeletal:  ?   Cervical back: Normal range of motion and neck supple.  ?Neurological:  ?   Mental Status: She is alert and oriented to person, place, and time.  ? ? ?BP 140/80 (BP Location: Right Arm, Patient Position: Sitting, Cuff Size: Normal)   Pulse 73   Temp 98.4 ?F (36.9 ?C) (Oral)   Resp 18   Ht '4\' 10"'$  (1.473 m)   Wt 103 lb (46.7 kg)   SpO2 99%   BMI 21.53 kg/m?  ?Wt Readings from Last 3 Encounters:  ?12/09/21 103 lb (46.7 kg)  ?12/05/20 106 lb 9.6 oz (48.4 kg)  ?10/13/18 129 lb 3.2 oz (58.6 kg)  ? ? ? ?Health Maintenance Due  ?Topic Date Due  ? TETANUS/TDAP  Never done  ? Zoster Vaccines- Shingrix (1 of 2) Never done  ? DEXA SCAN  04/04/2017  ? MAMMOGRAM  04/08/2019  ? COVID-19 Vaccine (3 - Moderna risk series) 03/09/2020  ? ? ?There are no preventive care reminders to display for this patient. ? ?Lab Results  ?Component Value Date  ? TSH 1.57 10/31/2014  ? ?Lab Results  ?Component Value Date  ? WBC 8.2 09/19/2018  ? HGB 14.4 09/19/2018  ? HCT 43.0 09/19/2018  ? MCV 99.5 09/19/2018  ? PLT 247.0 09/19/2018  ? ?Lab Results  ?Component Value Date  ? NA 135 12/05/2020  ? K 4.9 12/05/2020  ? CO2 27 12/05/2020  ? GLUCOSE 96 12/05/2020  ? BUN 27 (H) 12/05/2020  ? CREATININE 0.85 12/05/2020  ? BILITOT 0.6 12/05/2020  ? ALKPHOS 83 12/05/2020  ? AST 17 12/05/2020  ? ALT 14 12/05/2020  ? PROT 6.8 12/05/2020  ? ALBUMIN 4.3 12/05/2020  ? CALCIUM 10.0 12/05/2020  ? GFR 62.34 12/05/2020  ? ?Lab Results  ?Component Value Date  ? CHOL 186 12/05/2020  ? ?Lab Results  ?Component Value Date  ? HDL 67.60 12/05/2020  ? ?Lab Results  ?Component Value Date  ? LDLCALC 103 (H) 12/05/2020  ? ?Lab Results  ?Component Value Date  ? TRIG 79.0 12/05/2020  ? ?Lab  Results  ?Component Value Date  ? CHOLHDL 3 12/05/2020  ? ?Lab Results  ?Component Value Date  ? HGBA1C 5.8 12/05/2020  ? ? ?  ?Assessment & Plan:  ? ?Problem List Items Addressed This Visit   ?  None ?Visit Diagnoses   ? ? Essential hypertension      ? Relevant Medications  ? spironolactone (ALDACTONE) 25 MG tablet  ? nadolol (CORGARD) 20 MG tablet  ? losartan (COZAAR) 100 MG tablet  ? ?  ?Well controlled, no changes to meds. Encouraged heart healthy diet such as the DASH diet and exercise as tolerated.   ? ?Meds ordered this encounter  ?Medications  ? spironolactone (ALDACTONE) 25 MG tablet  ?  Sig: Take 1 tablet (25 mg total) by mouth every morning.  ?  Dispense:  90 tablet  ?  Refill:  3  ? nadolol (CORGARD) 20 MG tablet  ?  Sig: Take 1 tablet (20 mg total) by mouth every morning.  ?  Dispense:  90 tablet  ?  Refill:  3  ? losartan (COZAAR) 100 MG tablet  ?  Sig: Take 1 tablet (100 mg total) by mouth every morning.  ?  Dispense:  90 tablet  ?  Refill:  3  ? ? ?Follow-up: No follow-ups on file.  ? ? ?Ann Held, DO ?

## 2021-12-09 NOTE — Patient Instructions (Signed)

## 2021-12-25 DIAGNOSIS — H35373 Puckering of macula, bilateral: Secondary | ICD-10-CM | POA: Diagnosis not present

## 2022-06-11 ENCOUNTER — Ambulatory Visit (INDEPENDENT_AMBULATORY_CARE_PROVIDER_SITE_OTHER): Payer: PPO | Admitting: Family Medicine

## 2022-06-11 ENCOUNTER — Encounter: Payer: Self-pay | Admitting: Family Medicine

## 2022-06-11 VITALS — BP 130/80 | HR 72 | Temp 97.5°F | Resp 18 | Ht <= 58 in | Wt 98.8 lb

## 2022-06-11 DIAGNOSIS — Z23 Encounter for immunization: Secondary | ICD-10-CM | POA: Diagnosis not present

## 2022-06-11 DIAGNOSIS — Z0000 Encounter for general adult medical examination without abnormal findings: Secondary | ICD-10-CM | POA: Diagnosis not present

## 2022-06-11 DIAGNOSIS — H6123 Impacted cerumen, bilateral: Secondary | ICD-10-CM | POA: Diagnosis not present

## 2022-06-11 DIAGNOSIS — I10 Essential (primary) hypertension: Secondary | ICD-10-CM

## 2022-06-11 DIAGNOSIS — E782 Mixed hyperlipidemia: Secondary | ICD-10-CM

## 2022-06-11 LAB — CBC WITH DIFFERENTIAL/PLATELET
Basophils Absolute: 0 10*3/uL (ref 0.0–0.1)
Basophils Relative: 0.6 % (ref 0.0–3.0)
Eosinophils Absolute: 0.1 10*3/uL (ref 0.0–0.7)
Eosinophils Relative: 1.7 % (ref 0.0–5.0)
HCT: 37.8 % (ref 36.0–46.0)
Hemoglobin: 12.6 g/dL (ref 12.0–15.0)
Lymphocytes Relative: 18.1 % (ref 12.0–46.0)
Lymphs Abs: 1.3 10*3/uL (ref 0.7–4.0)
MCHC: 33.4 g/dL (ref 30.0–36.0)
MCV: 99.7 fl (ref 78.0–100.0)
Monocytes Absolute: 0.7 10*3/uL (ref 0.1–1.0)
Monocytes Relative: 10 % (ref 3.0–12.0)
Neutro Abs: 5.2 10*3/uL (ref 1.4–7.7)
Neutrophils Relative %: 69.6 % (ref 43.0–77.0)
Platelets: 262 10*3/uL (ref 150.0–400.0)
RBC: 3.79 Mil/uL — ABNORMAL LOW (ref 3.87–5.11)
RDW: 14.2 % (ref 11.5–15.5)
WBC: 7.4 10*3/uL (ref 4.0–10.5)

## 2022-06-11 LAB — LIPID PANEL
Cholesterol: 164 mg/dL (ref 0–200)
HDL: 55.9 mg/dL (ref >39.00)
LDL Cholesterol: 96 mg/dL (ref 0–99)
NonHDL: 108.49
Total CHOL/HDL Ratio: 3
Triglycerides: 60 mg/dL (ref 0.0–149.0)
VLDL: 12 mg/dL (ref 0.0–40.0)

## 2022-06-11 LAB — COMPREHENSIVE METABOLIC PANEL
ALT: 12 U/L (ref 0–35)
AST: 17 U/L (ref 0–37)
Albumin: 4 g/dL (ref 3.5–5.2)
Alkaline Phosphatase: 92 U/L (ref 39–117)
BUN: 34 mg/dL — ABNORMAL HIGH (ref 6–23)
CO2: 24 mEq/L (ref 19–32)
Calcium: 9.4 mg/dL (ref 8.4–10.5)
Chloride: 103 mEq/L (ref 96–112)
Creatinine, Ser: 0.84 mg/dL (ref 0.40–1.20)
GFR: 62.57 mL/min (ref >60.00)
Glucose, Bld: 89 mg/dL (ref 70–99)
Potassium: 4.6 mEq/L (ref 3.5–5.1)
Sodium: 137 mEq/L (ref 135–145)
Total Bilirubin: 0.6 mg/dL (ref 0.2–1.2)
Total Protein: 6.9 g/dL (ref 6.0–8.3)

## 2022-06-11 MED ORDER — NADOLOL 20 MG PO TABS: 20.0000 mg | ORAL_TABLET | Freq: Every morning | ORAL | 3 refills | Status: DC

## 2022-06-11 MED ORDER — SPIRONOLACTONE 25 MG PO TABS: 25.0000 mg | ORAL_TABLET | Freq: Every morning | ORAL | 3 refills | Status: DC

## 2022-06-11 NOTE — Assessment & Plan Note (Signed)
Encourage heart healthy diet such as MIND or DASH diet, increase exercise, avoid trans fats, simple carbohydrates and processed foods, consider a krill or fish or flaxseed oil cap daily.  °

## 2022-06-11 NOTE — Assessment & Plan Note (Signed)
Use debrox/ cerumenex ----- rto for irrigation

## 2022-06-11 NOTE — Patient Instructions (Signed)
Preventive Care 65 Years and Older, Female Preventive care refers to lifestyle choices and visits with your health care provider that can promote health and wellness. Preventive care visits are also called wellness exams. What can I expect for my preventive care visit? Counseling Your health care provider may ask you questions about your: Medical history, including: Past medical problems. Family medical history. Pregnancy and menstrual history. History of falls. Current health, including: Memory and ability to understand (cognition). Emotional well-being. Home life and relationship well-being. Sexual activity and sexual health. Lifestyle, including: Alcohol, nicotine or tobacco, and drug use. Access to firearms. Diet, exercise, and sleep habits. Work and work environment. Sunscreen use. Safety issues such as seatbelt and bike helmet use. Physical exam Your health care provider will check your: Height and weight. These may be used to calculate your BMI (body mass index). BMI is a measurement that tells if you are at a healthy weight. Waist circumference. This measures the distance around your waistline. This measurement also tells if you are at a healthy weight and may help predict your risk of certain diseases, such as type 2 diabetes and high blood pressure. Heart rate and blood pressure. Body temperature. Skin for abnormal spots. What immunizations do I need?  Vaccines are usually given at various ages, according to a schedule. Your health care provider will recommend vaccines for you based on your age, medical history, and lifestyle or other factors, such as travel or where you work. What tests do I need? Screening Your health care provider may recommend screening tests for certain conditions. This may include: Lipid and cholesterol levels. Hepatitis C test. Hepatitis B test. HIV (human immunodeficiency virus) test. STI (sexually transmitted infection) testing, if you are at  risk. Lung cancer screening. Colorectal cancer screening. Diabetes screening. This is done by checking your blood sugar (glucose) after you have not eaten for a while (fasting). Mammogram. Talk with your health care provider about how often you should have regular mammograms. BRCA-related cancer screening. This may be done if you have a family history of breast, ovarian, tubal, or peritoneal cancers. Bone density scan. This is done to screen for osteoporosis. Talk with your health care provider about your test results, treatment options, and if necessary, the need for more tests. Follow these instructions at home: Eating and drinking  Eat a diet that includes fresh fruits and vegetables, whole grains, lean protein, and low-fat dairy products. Limit your intake of foods with high amounts of sugar, saturated fats, and salt. Take vitamin and mineral supplements as recommended by your health care provider. Do not drink alcohol if your health care provider tells you not to drink. If you drink alcohol: Limit how much you have to 0-1 drink a day. Know how much alcohol is in your drink. In the [place] one drink equals one 12 oz bottle of beer (355 mL), one 5 oz glass of wine (148 mL), or one 1 oz glass of hard liquor (44 mL). Lifestyle Brush your teeth every morning and night with fluoride toothpaste. Floss one time each day. Exercise for at least 30 minutes 5 or more days each week. Do not use any products that contain nicotine or tobacco. These products include cigarettes, chewing tobacco, and vaping devices, such as e-cigarettes. If you need help quitting, ask your health care provider. Do not use drugs. If you are sexually active, practice safe sex. Use a condom or other form of protection in order to prevent STIs. Take aspirin only as told by   your health care provider. Make sure that you understand how much to take and what form to take. Work with your health care provider to find out whether it  is safe and beneficial for you to take aspirin daily. Ask your health care provider if you need to take a cholesterol-lowering medicine (statin). Find healthy ways to manage stress, such as: Meditation, yoga, or listening to music. Journaling. Talking to a trusted person. Spending time with friends and family. Minimize exposure to UV radiation to reduce your risk of skin cancer. Safety Always wear your seat belt while driving or riding in a vehicle. Do not drive: If you have been drinking alcohol. Do not ride with someone who has been drinking. When you are tired or distracted. While texting. If you have been using any mind-altering substances or drugs. Wear a helmet and other protective equipment during sports activities. If you have firearms in your house, make sure you follow all gun safety procedures. What's next? Visit your health care provider once a year for an annual wellness visit. Ask your health care provider how often you should have your eyes and teeth checked. Stay up to date on all vaccines. This information is not intended to replace advice given to you by your health care provider. Make sure you discuss any questions you have with your health care provider. Document Revised: 02/12/2021 Document Reviewed: 02/12/2021 Elsevier Patient Education  2023 Elsevier Inc.  

## 2022-06-11 NOTE — Progress Notes (Signed)
Subjective:   By signing my name below, I, Shehryar Baig, attest that this documentation has been prepared under the direction and in the presence of Ann Held, DO. 06/11/2022    Patient ID: Shannon Austin, female    DOB: 1932/10/03, 88 y.o.   MRN: [id number]  Chief Complaint  Patient presents with   Annual Exam    Pt states fasting     HPI Patient is in today for a comprehensive physical exam.   She is requesting a refill for 100 mg losartan, 20 mg nadolol, 25 mg aldactone.  She continues using a cane to help assist with mobility. She is currently using a wheelchair during this visit due to getting tired from walking from the parking lot to the office.  She denies having any fever, new muscle pain, new joint pain, new moles, congestion, sinus pain, sore throat, chest pain, palpations, cough, SOB, wheezing, n/v/d, constipation, blood in stool, dysuria, frequency, hematuria, or headaches at this time. She is no longer driving due to her diminished eye sight. She has transportation accessibility at this time and is planning on moving near her daughter.  She has no changes to her family medical history.  She is receiving the flu vaccine during this visit. She is due for the tetanus vaccine and is interested in receiving it at her pharmacy. She is due for the shingles vaccine and is interested in receiving it at her pharmacy.    Past Medical History:  Diagnosis Date   Gross hematuria    Hyperlipidemia    Hypertension    Osteoporosis    Supraventricular tachycardia    Vaginitis    atropic    Past Surgical History:  Procedure Laterality Date   APPENDECTOMY     CARPAL TUNNEL RELEASE     right   CATARACT EXTRACTION     left eye   CESAREAN SECTION     CYSTOSCOPY  05/09/10, 09/11/10, 11/21/12, 01/01/15   DILATION AND CURETTAGE OF UTERUS     fibrocystic mass     right    Family History  Problem Relation Age of Onset   Hypertension Mother    Hypertension Father     Diabetes Maternal Grandmother     Social History   Socioeconomic History   Marital status: Single    Spouse name: Not on file   Number of children: Not on file   Years of education: Not on file   Highest education level: Not on file  Occupational History   Occupation: nursing- retired  Tobacco Use   Smoking status: Former   Smokeless tobacco: Never  Substance and Sexual Activity   Alcohol use: Yes    Comment: rare   Drug use: No   Sexual activity: Not Currently  Other Topics Concern   Not on file  Social History Narrative   Widower (2000)   Social Determinants of Health   Financial Resource Strain: Low Risk  (12/13/2020)   Overall Financial Resource Strain (CARDIA)    Difficulty of Paying Living Expenses: Not hard at all  Food Insecurity: Not on file  Transportation Needs: Not on file  Physical Activity: Insufficiently Active (12/13/2020)   Exercise Vital Sign    Days of Exercise per Week: 3 days    Minutes of Exercise per Session: 20 min  Stress: Not on file  Social Connections: Not on file  Intimate Partner Violence: Not on file    Outpatient Medications Prior to Visit  Medication Sig Dispense  Refill   fish oil-omega-3 fatty acids 1000 MG capsule Take 1,200 mg by mouth daily.     ipratropium (ATROVENT) 0.06 % nasal spray Place 2 sprays into both nostrils 4 (four) times daily as needed for rhinitis.     losartan (COZAAR) 100 MG tablet Take 1 tablet (100 mg total) by mouth every morning. 90 tablet 3   Multiple Vitamins-Minerals (PRESERVISION AREDS PO) Take 1 tablet by mouth daily.     nadolol (CORGARD) 20 MG tablet Take 1 tablet (20 mg total) by mouth every morning. 90 tablet 3   spironolactone (ALDACTONE) 25 MG tablet Take 1 tablet (25 mg total) by mouth every morning. 90 tablet 3   Apoaequorin (PREVAGEN PO) Take 1 tablet by mouth daily. (Patient not taking: Reported on 06/11/2022)     ESTRACE VAGINAL 0.1 MG/GM vaginal cream Place 1 Applicatorful vaginally 3 (three)  times a week.  (Patient not taking: Reported on 06/11/2022)     No facility-administered medications prior to visit.    Allergies  Allergen Reactions   Latex Rash   Lisinopril Cough   Amlodipine Besylate    Ezetimibe    Hydrochlorothiazide    Statins     Muscle weakness   Sulfonamide Derivatives Rash and Other (See Comments)    HA    Review of Systems  Constitutional:  Negative for fever and malaise/fatigue.  HENT:  Negative for congestion, sinus pain and sore throat.   Eyes:  Negative for blurred vision.  Respiratory:  Negative for cough, shortness of breath and wheezing.   Cardiovascular:  Negative for chest pain, palpitations and leg swelling.  Gastrointestinal:  Negative for abdominal pain, blood in stool, constipation, diarrhea, nausea and vomiting.  Genitourinary:  Negative for dysuria, frequency and hematuria.  Musculoskeletal:  Negative for falls.       (-)new muscle pain (-)new joint pain  Skin:  Negative for rash.       (-)New moles  Neurological:  Negative for dizziness, loss of consciousness and headaches.  Endo/Heme/Allergies:  Negative for environmental allergies.  Psychiatric/Behavioral:  Negative for depression. The patient is not nervous/anxious.        Objective:    Physical Exam Vitals and nursing note reviewed.  Constitutional:      General: She is not in acute distress.    Appearance: Normal appearance. She is not ill-appearing.  HENT:     Head: Normocephalic and atraumatic.     Right Ear: Tympanic membrane, ear canal and external ear normal. There is impacted cerumen.     Left Ear: Tympanic membrane, ear canal and external ear normal.  Eyes:     Extraocular Movements: Extraocular movements intact.     Pupils: Pupils are equal, round, and reactive to light.  Cardiovascular:     Rate and Rhythm: Normal rate and regular rhythm.     Heart sounds: Normal heart sounds. No murmur heard.    No gallop.  Pulmonary:     Effort: Pulmonary effort is  normal. No respiratory distress.     Breath sounds: Normal breath sounds. No wheezing or rales.  Abdominal:     General: Bowel sounds are normal. There is no distension.     Palpations: Abdomen is soft.     Tenderness: There is no abdominal tenderness. There is no guarding.  Skin:    General: Skin is warm and dry.  Neurological:     Mental Status: She is alert and oriented to person, place, and time.  Psychiatric:  Mood and Affect: Mood normal.        Thought Content: Thought content normal.        Judgment: Judgment normal.     BP 130/80 (BP Location: Right Arm, Patient Position: Sitting, Cuff Size: Normal)   Pulse 72   Temp (!) 97.5 F (36.4 C) (Oral)   Resp 18   Ht '4\' 10"'$  (1.473 m)   Wt 98 lb 12.8 oz (44.8 kg)   SpO2 99%   BMI 20.65 kg/m  Wt Readings from Last 3 Encounters:  06/11/22 98 lb 12.8 oz (44.8 kg)  12/09/21 103 lb (46.7 kg)  12/05/20 106 lb 9.6 oz (48.4 kg)    Diabetic Foot Exam - Simple   No data filed    Lab Results  Component Value Date   WBC 7.4 06/11/2022   HGB 12.6 06/11/2022   HCT 37.8 06/11/2022   PLT 262.0 06/11/2022   GLUCOSE 89 06/11/2022   CHOL 164 06/11/2022   TRIG 60.0 06/11/2022   HDL 55.90 06/11/2022   LDLDIRECT 125.8 05/03/2013   LDLCALC 96 06/11/2022   ALT 12 06/11/2022   AST 17 06/11/2022   NA 137 06/11/2022   K 4.6 06/11/2022   CL 103 06/11/2022   CREATININE 0.84 06/11/2022   BUN 34 (H) 06/11/2022   CO2 24 06/11/2022   TSH 1.57 10/31/2014   INR 1.0 07/19/2008   HGBA1C 5.8 12/05/2020    Lab Results  Component Value Date   TSH 1.57 10/31/2014   Lab Results  Component Value Date   WBC 7.4 06/11/2022   HGB 12.6 06/11/2022   HCT 37.8 06/11/2022   MCV 99.7 06/11/2022   PLT 262.0 06/11/2022   Lab Results  Component Value Date   NA 137 06/11/2022   K 4.6 06/11/2022   CO2 24 06/11/2022   GLUCOSE 89 06/11/2022   BUN 34 (H) 06/11/2022   CREATININE 0.84 06/11/2022   BILITOT 0.6 06/11/2022   ALKPHOS 92  06/11/2022   AST 17 06/11/2022   ALT 12 06/11/2022   PROT 6.9 06/11/2022   ALBUMIN 4.0 06/11/2022   CALCIUM 9.4 06/11/2022   GFR 62.57 06/11/2022   Lab Results  Component Value Date   CHOL 164 06/11/2022   Lab Results  Component Value Date   HDL 55.90 06/11/2022   Lab Results  Component Value Date   LDLCALC 96 06/11/2022   Lab Results  Component Value Date   TRIG 60.0 06/11/2022   Lab Results  Component Value Date   CHOLHDL 3 06/11/2022   Lab Results  Component Value Date   HGBA1C 5.8 12/05/2020       Assessment & Plan:   Problem List Items Addressed This Visit       Unprioritized   Preventative health care - Primary    ghm utd Check labs  See avs      Hyperlipidemia    Encourage heart healthy diet such as MIND or DASH diet, increase exercise, avoid trans fats, simple carbohydrates and processed foods, consider a krill or fish or flaxseed oil cap daily.       Relevant Medications   nadolol (CORGARD) 20 MG tablet   spironolactone (ALDACTONE) 25 MG tablet   Bilateral impacted cerumen    Use debrox/ cerumenex ----- rto for irrigation       Other Visit Diagnoses     Need for influenza vaccination       Relevant Orders   Flu Vaccine QUAD High Dose(Fluad) (Completed)   Essential  hypertension       Relevant Medications   nadolol (CORGARD) 20 MG tablet   spironolactone (ALDACTONE) 25 MG tablet   Other Relevant Orders   CBC with Differential/Platelet (Completed)   Comprehensive metabolic panel (Completed)   Lipid panel (Completed)        Meds ordered this encounter  Medications   nadolol (CORGARD) 20 MG tablet    Sig: Take 1 tablet (20 mg total) by mouth every morning.    Dispense:  90 tablet    Refill:  3   spironolactone (ALDACTONE) 25 MG tablet    Sig: Take 1 tablet (25 mg total) by mouth every morning.    Dispense:  90 tablet    Refill:  3    I, Ann Held, DO, personally preformed the services described in this  documentation.  All medical record entries made by the scribe were at my direction and in my presence.  I have reviewed the chart and discharge instructions (if applicable) and agree that the record reflects my personal performance and is accurate and complete. 06/11/2022   I,Shehryar Baig,acting as a scribe for Ann Held, DO.,have documented all relevant documentation on the behalf of Ann Held, DO,as directed by  Ann Held, DO while in the presence of Ann Held, DO.  Ann Held, DO

## 2022-06-11 NOTE — Assessment & Plan Note (Signed)
ghm utd Check labs  See avs  

## 2022-07-29 DIAGNOSIS — H6123 Impacted cerumen, bilateral: Secondary | ICD-10-CM | POA: Diagnosis not present

## 2022-12-11 ENCOUNTER — Telehealth: Payer: Self-pay | Admitting: Family Medicine

## 2022-12-11 ENCOUNTER — Ambulatory Visit (INDEPENDENT_AMBULATORY_CARE_PROVIDER_SITE_OTHER): Payer: PPO | Admitting: Family Medicine

## 2022-12-11 ENCOUNTER — Encounter: Payer: Self-pay | Admitting: Family Medicine

## 2022-12-11 VITALS — BP 130/70 | HR 68 | Temp 97.7°F | Resp 18 | Ht <= 58 in

## 2022-12-11 DIAGNOSIS — E782 Mixed hyperlipidemia: Secondary | ICD-10-CM

## 2022-12-11 DIAGNOSIS — I10 Essential (primary) hypertension: Secondary | ICD-10-CM

## 2022-12-11 DIAGNOSIS — Z111 Encounter for screening for respiratory tuberculosis: Secondary | ICD-10-CM

## 2022-12-11 LAB — LIPID PANEL
Cholesterol: 165 mg/dL (ref 0–200)
HDL: 64.5 mg/dL (ref >39.00)
LDL Cholesterol: 87 mg/dL (ref 0–99)
NonHDL: 100.18
Total CHOL/HDL Ratio: 3
Triglycerides: 65 mg/dL (ref 0.0–149.0)
VLDL: 13 mg/dL (ref 0.0–40.0)

## 2022-12-11 LAB — CBC WITH DIFFERENTIAL/PLATELET
Basophils Absolute: 0 10*3/uL (ref 0.0–0.1)
Basophils Relative: 0.6 % (ref 0.0–3.0)
Eosinophils Absolute: 0.2 10*3/uL (ref 0.0–0.7)
Eosinophils Relative: 3 % (ref 0.0–5.0)
HCT: 38.2 % (ref 36.0–46.0)
Hemoglobin: 12.8 g/dL (ref 12.0–15.0)
Lymphocytes Relative: 23 % (ref 12.0–46.0)
Lymphs Abs: 1.5 10*3/uL (ref 0.7–4.0)
MCHC: 33.4 g/dL (ref 30.0–36.0)
MCV: 99.2 fl (ref 78.0–100.0)
Monocytes Absolute: 0.7 10*3/uL (ref 0.1–1.0)
Monocytes Relative: 11.3 % (ref 3.0–12.0)
Neutro Abs: 4.1 10*3/uL (ref 1.4–7.7)
Neutrophils Relative %: 62.1 % (ref 43.0–77.0)
Platelets: 231 10*3/uL (ref 150.0–400.0)
RBC: 3.85 Mil/uL — ABNORMAL LOW (ref 3.87–5.11)
RDW: 14.2 % (ref 11.5–15.5)
WBC: 6.6 10*3/uL (ref 4.0–10.5)

## 2022-12-11 LAB — COMPREHENSIVE METABOLIC PANEL
ALT: 13 U/L (ref 0–35)
AST: 16 U/L (ref 0–37)
Albumin: 4 g/dL (ref 3.5–5.2)
Alkaline Phosphatase: 85 U/L (ref 39–117)
BUN: 23 mg/dL (ref 6–23)
CO2: 28 mEq/L (ref 19–32)
Calcium: 9.4 mg/dL (ref 8.4–10.5)
Chloride: 103 mEq/L (ref 96–112)
Creatinine, Ser: 0.74 mg/dL (ref 0.40–1.20)
GFR: 72.59 mL/min (ref >60.00)
Glucose, Bld: 105 mg/dL — ABNORMAL HIGH (ref 70–99)
Potassium: 4.9 mEq/L (ref 3.5–5.1)
Sodium: 139 mEq/L (ref 135–145)
Total Bilirubin: 0.6 mg/dL (ref 0.2–1.2)
Total Protein: 6.3 g/dL (ref 6.0–8.3)

## 2022-12-11 LAB — TSH: TSH: 2.89 u[IU]/mL (ref 0.35–5.50)

## 2022-12-11 MED ORDER — LOSARTAN POTASSIUM 100 MG PO TABS: 100.0000 mg | ORAL_TABLET | Freq: Every morning | ORAL | 3 refills | Status: DC

## 2022-12-11 MED ORDER — NADOLOL 20 MG PO TABS: 20.0000 mg | ORAL_TABLET | Freq: Every morning | ORAL | 3 refills | Status: DC

## 2022-12-11 MED ORDER — SPIRONOLACTONE 25 MG PO TABS
25.0000 mg | ORAL_TABLET | Freq: Every morning | ORAL | 3 refills | Status: DC
Start: 2022-12-11 — End: 2022-12-17

## 2022-12-11 NOTE — Assessment & Plan Note (Signed)
Encourage heart healthy diet such as MIND or DASH diet, increase exercise, avoid trans fats, simple carbohydrates and processed foods, consider a krill or fish or flaxseed oil cap daily.  °

## 2022-12-11 NOTE — Assessment & Plan Note (Signed)
Well controlled, no changes to meds. Encouraged heart healthy diet such as the DASH diet and exercise as tolerated.  °

## 2022-12-11 NOTE — Telephone Encounter (Signed)
Patient's daughter called stating her mom never received the scripts for her medication during her visit today. Please advice.

## 2022-12-11 NOTE — Patient Instructions (Signed)

## 2022-12-11 NOTE — Telephone Encounter (Signed)
Pt said she is moving to [facility] and she was supposed to get written scripts from Dr. Laury Axon for when she moves but she didn't receive them. She also didn't get her AVS. Pt would like them to be mailed to her since she does not have a car and cannot get out here and her daughter has gone back to [place]. Please call to advise.

## 2022-12-11 NOTE — Progress Notes (Signed)
Established Patient Office Visit  Subjective   Patient ID: Shannon Austin, female    DOB: Dec 16, 1932  Age: 89 y.o. MRN: [id number]  Chief Complaint  Patient presents with   Hypertension   Follow-up    HPI Pt here f/u htn   pt is here with her daughter.  Pt is getting ready to move to assisted living  Pt will have in house Dr there. She needs paperwork filled out.  The assisted living should be faxing it over No new complaints   Patient Active Problem List   Diagnosis Date Noted   Bilateral impacted cerumen 06/11/2022   Hyperglycemia 12/05/2020   Spinal stenosis of lumbar region 05/08/2015   Atypical nevus 10/31/2014   Preventative health care 01/07/2012   BPPV (benign paroxysmal positional vertigo) 10/16/2011   DYSURIA 05/09/2009   Hematuria 10/12/2008   VAGINITIS, ATROPHIC, POSTMENOPAUSAL 10/12/2008   Osteoporosis 10/12/2008   Hyperlipidemia 09/25/2008   HYPERTENSION, BENIGN 09/25/2008   SVT/ PSVT/ PAT 09/25/2008   Past Medical History:  Diagnosis Date   Gross hematuria    Hyperlipidemia    Hypertension    Osteoporosis    Supraventricular tachycardia    Vaginitis    atropic   Past Surgical History:  Procedure Laterality Date   APPENDECTOMY     CARPAL TUNNEL RELEASE     right   CATARACT EXTRACTION     left eye   CESAREAN SECTION     CYSTOSCOPY  05/09/10, 09/11/10, 11/21/12, 01/01/15   DILATION AND CURETTAGE OF UTERUS     fibrocystic mass     right   Social History   Tobacco Use   Smoking status: Former   Smokeless tobacco: Never  Substance Use Topics   Alcohol use: Yes    Comment: rare   Drug use: No   Social History   Socioeconomic History   Marital status: Single    Spouse name: Not on file   Number of children: Not on file   Years of education: Not on file   Highest education level: Not on file  Occupational History   Occupation: nursing- retired  Tobacco Use   Smoking status: Former   Smokeless tobacco: Never  Substance and Sexual Activity    Alcohol use: Yes    Comment: rare   Drug use: No   Sexual activity: Not Currently  Other Topics Concern   Not on file  Social History Narrative   Widower (2000)   Social Determinants of Health   Financial Resource Strain: Low Risk  (12/13/2020)   Overall Financial Resource Strain (CARDIA)    Difficulty of Paying Living Expenses: Not hard at all  Food Insecurity: Not on file  Transportation Needs: Not on file  Physical Activity: Insufficiently Active (12/13/2020)   Exercise Vital Sign    Days of Exercise per Week: 3 days    Minutes of Exercise per Session: 20 min  Stress: Not on file  Social Connections: Not on file  Intimate Partner Violence: Not on file   Family Status  Relation Name Status   Mother  Deceased   Father  Deceased at age 51       Post op PNA   MGM  (Not Specified)   Family History  Problem Relation Age of Onset   Hypertension Mother    Hypertension Father    Diabetes Maternal Grandmother    Allergies  Allergen Reactions   Latex Rash   Lisinopril Cough   Amlodipine Besylate    Ezetimibe  Hydrochlorothiazide    Statins     Muscle weakness   Sulfonamide Derivatives Rash and Other (See Comments)    HA      Review of Systems  Constitutional:  Negative for fever and malaise/fatigue.  HENT:  Negative for congestion.   Eyes:  Negative for blurred vision.  Respiratory:  Negative for shortness of breath.   Cardiovascular:  Negative for chest pain, palpitations and leg swelling.  Gastrointestinal:  Negative for abdominal pain, blood in stool and nausea.  Genitourinary:  Negative for dysuria and frequency.  Musculoskeletal:  Negative for falls.  Skin:  Negative for rash.  Neurological:  Negative for dizziness, loss of consciousness and headaches.  Endo/Heme/Allergies:  Negative for environmental allergies.  Psychiatric/Behavioral:  Negative for depression. The patient is not nervous/anxious.       Objective:     BP 130/70 (BP Location: Right  Arm, Patient Position: Sitting, Cuff Size: Normal)   Pulse 68   Temp 97.7 F (36.5 C) (Oral)   Resp 18   Ht  (1.473 m)   SpO2 98%   BMI 20.65 kg/m  BP Readings from Last 3 Encounters:  12/11/22 130/70  06/11/22 130/80  12/09/21 140/80   Wt Readings from Last 3 Encounters:  06/11/22 98 lb 12.8 oz (44.8 kg)  12/09/21 103 lb (46.7 kg)  12/05/20 106 lb 9.6 oz (48.4 kg)   SpO2 Readings from Last 3 Encounters:  12/11/22 98%  06/11/22 99%  12/09/21 99%      Physical Exam Vitals and nursing note reviewed.  Constitutional:      Appearance: She is well-developed.  HENT:     Head: Normocephalic and atraumatic.     Nose: Nose normal.  Eyes:     Conjunctiva/sclera: Conjunctivae normal.  Neck:     Thyroid: No thyromegaly.     Vascular: No carotid bruit or JVD.  Cardiovascular:     Rate and Rhythm: Normal rate and regular rhythm.     Heart sounds: Normal heart sounds. No murmur heard. Pulmonary:     Effort: Pulmonary effort is normal. No respiratory distress.     Breath sounds: Normal breath sounds. No wheezing or rales.  Chest:     Chest wall: No tenderness.  Musculoskeletal:     Cervical back: Normal range of motion and neck supple.  Neurological:     General: No focal deficit present.     Mental Status: She is alert and oriented to person, place, and time.      No results found for any visits on 12/11/22.  Last CBC Lab Results  Component Value Date   WBC 7.4 06/11/2022   HGB 12.6 06/11/2022   HCT 37.8 06/11/2022   MCV 99.7 06/11/2022   RDW 14.2 06/11/2022   PLT 262.0 06/11/2022   Last metabolic panel Lab Results  Component Value Date   GLUCOSE 89 06/11/2022   NA 137 06/11/2022   K 4.6 06/11/2022   CL 103 06/11/2022   CO2 24 06/11/2022   BUN 34 (H) 06/11/2022   CREATININE 0.84 06/11/2022   CALCIUM 9.4 06/11/2022   PROT 6.9 06/11/2022   ALBUMIN 4.0 06/11/2022   BILITOT 0.6 06/11/2022   ALKPHOS 92 06/11/2022   AST 17 06/11/2022   ALT 12  06/11/2022   Last lipids Lab Results  Component Value Date   CHOL 164 06/11/2022   HDL 55.90 06/11/2022   LDLCALC 96 06/11/2022   LDLDIRECT 125.8 05/03/2013   TRIG 60.0 06/11/2022   CHOLHDL 3  06/11/2022   Last hemoglobin A1c Lab Results  Component Value Date   HGBA1C 5.8 12/05/2020   Last thyroid functions Lab Results  Component Value Date   TSH 1.57 10/31/2014   Last vitamin D Lab Results  Component Value Date   VD25OH 39.42 10/31/2014   Last vitamin B12 and Folate No results found for: "VITAMINB12", "FOLATE"    The ASCVD Risk score (Arnett DK, et al., 2019) failed to calculate for the following reasons:   The 2019 ASCVD risk score is only valid for ages 37 to 31    Assessment & Plan:   Problem List Items Addressed This Visit       Unprioritized   HYPERTENSION, BENIGN    Well controlled, no changes to meds. Encouraged heart healthy diet such as the DASH diet and exercise as tolerated.        Relevant Medications   losartan (COZAAR) 100 MG tablet   nadolol (CORGARD) 20 MG tablet   spironolactone (ALDACTONE) 25 MG tablet   Hyperlipidemia    Encourage heart healthy diet such as MIND or DASH diet, increase exercise, avoid trans fats, simple carbohydrates and processed foods, consider a krill or fish or flaxseed oil cap daily.        Relevant Medications   losartan (COZAAR) 100 MG tablet   nadolol (CORGARD) 20 MG tablet   spironolactone (ALDACTONE) 25 MG tablet   Other Visit Diagnoses     Screening-pulmonary TB    -  Primary   Relevant Orders   QuantiFERON-TB Gold Plus   Essential hypertension       Relevant Medications   losartan (COZAAR) 100 MG tablet   nadolol (CORGARD) 20 MG tablet   spironolactone (ALDACTONE) 25 MG tablet   Other Relevant Orders   CBC with Differential/Platelet   Comprehensive metabolic panel   Lipid panel   TSH   QuantiFERON-TB Gold Plus       Return if symptoms worsen or fail to improve.    Donato Schultz,  DO

## 2022-12-13 ENCOUNTER — Encounter: Payer: Self-pay | Admitting: Family Medicine

## 2022-12-13 LAB — QUANTIFERON-TB GOLD PLUS
Mitogen-NIL: >10 IU/mL
NIL: 0.02 IU/mL
QuantiFERON-TB Gold Plus: NEGATIVE
TB1-NIL: 0 IU/mL
TB2-NIL: 0.01 IU/mL

## 2022-12-14 NOTE — Telephone Encounter (Signed)
Spoke to patient and she has not received but it looks like prescriptions were printed out Friday 4/12. Patient advised if she does not receive in the next 2 days to let us know

## 2022-12-14 NOTE — Progress Notes (Signed)
Letter mailed

## 2022-12-16 ENCOUNTER — Other Ambulatory Visit: Payer: Self-pay | Admitting: Family Medicine

## 2022-12-16 ENCOUNTER — Telehealth: Payer: Self-pay | Admitting: Family Medicine

## 2022-12-16 DIAGNOSIS — I10 Essential (primary) hypertension: Secondary | ICD-10-CM

## 2022-12-16 MED ORDER — IPRATROPIUM BROMIDE 0.06 % NA SOLN: 2.0000 | Freq: Four times a day (QID) | NASAL | 1 refills | Status: AC | PRN

## 2022-12-16 NOTE — Telephone Encounter (Signed)
Left message on machine that rx has been sent in. 

## 2022-12-16 NOTE — Telephone Encounter (Signed)
Pt still has not received the written prescriptions that were mailed to her house on 4/12. Pt said she does still wish to have all of her prescriptions mailed to her so when she moves to [facility] she has them to take to whatever pharmacy she goes to.

## 2022-12-16 NOTE — Telephone Encounter (Signed)
Per review of chart for other medication refills, all other medications need to be sent to the same pharmacy and not printed.  Prescription Request  12/16/2022  Is this a "Controlled Substance" medicine? No  LOV: 12/11/2022  What is the name of the medication or equipment?   ipratropium (ATROVENT) 0.06 % nasal spray [[id number]]   Have you contacted your pharmacy to request a refill? Yes   Which pharmacy would you like this sent to?  [facility] - [place], [place] - [address] [place] [address] Phone: [phone] Fax: [phone]    Patient notified that their request is being sent to the clinical staff for review and that they should receive a response within 2 business days.   Please advise at Mobile [phone] (mobile)

## 2022-12-16 NOTE — Addendum Note (Signed)
Addended by: Thelma Barge D on: 12/16/2022 11:10 AM   Modules accepted: Orders

## 2022-12-17 ENCOUNTER — Other Ambulatory Visit: Payer: Self-pay | Admitting: *Deleted

## 2022-12-17 MED ORDER — SPIRONOLACTONE 25 MG PO TABS
25.0000 mg | ORAL_TABLET | Freq: Every morning | ORAL | 3 refills | Status: DC
Start: 2022-12-17 — End: 2023-03-10

## 2022-12-17 MED ORDER — NADOLOL 20 MG PO TABS
20.0000 mg | ORAL_TABLET | Freq: Every morning | ORAL | 3 refills | Status: DC
Start: 2022-12-17 — End: 2022-12-17

## 2022-12-17 MED ORDER — NADOLOL 20 MG PO TABS
20.0000 mg | ORAL_TABLET | Freq: Every morning | ORAL | 3 refills | Status: DC
Start: 2022-12-17 — End: 2023-03-10

## 2022-12-17 NOTE — Addendum Note (Signed)
Addended by: Thelma Barge D on: 12/17/2022 03:40 PM   Modules accepted: Orders

## 2022-12-17 NOTE — Addendum Note (Signed)
Addended by: Thelma Barge D on: 12/17/2022 10:06 AM   Modules accepted: Orders

## 2022-12-17 NOTE — Telephone Encounter (Signed)
Pt stated she needed her rxs now.  [facility]  Rxs sent .

## 2023-01-12 ENCOUNTER — Telehealth: Payer: Self-pay | Admitting: Family Medicine

## 2023-01-12 NOTE — Telephone Encounter (Signed)
Pharmacy updated.

## 2023-01-12 NOTE — Telephone Encounter (Signed)
Patient states she will no longer be using [facility] and would like to go back to her old pharmacy. She did not request any refills at this time.    [id number] [place], [place] - [address] AT [facility] [address], [place] [address] Phone: [phone]  Fax: [phone]

## 2023-03-10 ENCOUNTER — Other Ambulatory Visit: Payer: Self-pay | Admitting: Family Medicine

## 2023-03-10 DIAGNOSIS — I10 Essential (primary) hypertension: Secondary | ICD-10-CM

## 2023-03-24 ENCOUNTER — Telehealth: Payer: Self-pay

## 2023-03-24 NOTE — Telephone Encounter (Signed)
Reached out to patient to set up follow up visit with provider to discuss chronic conditions.  She indicated she is moving to [place] to be near her daughter.    Elijio Miles [facility]ist

## 2023-05-06 ENCOUNTER — Telehealth: Payer: Self-pay | Admitting: Family Medicine

## 2023-05-06 DIAGNOSIS — I10 Essential (primary) hypertension: Secondary | ICD-10-CM

## 2023-05-06 MED ORDER — NADOLOL 20 MG PO TABS
20.0000 mg | ORAL_TABLET | Freq: Every morning | ORAL | 1 refills | Status: DC
Start: 2023-05-06 — End: 2023-12-07

## 2023-05-06 MED ORDER — LOSARTAN POTASSIUM 100 MG PO TABS
100.0000 mg | ORAL_TABLET | Freq: Every morning | ORAL | 1 refills | Status: DC
Start: 2023-05-06 — End: 2023-11-19

## 2023-05-06 MED ORDER — SPIRONOLACTONE 25 MG PO TABS
25.0000 mg | ORAL_TABLET | Freq: Every morning | ORAL | 1 refills | Status: DC
Start: 2023-05-06 — End: 2023-12-07

## 2023-05-06 NOTE — Telephone Encounter (Signed)
Refills sent

## 2023-05-06 NOTE — Telephone Encounter (Signed)
Prescription Request  05/06/2023  Is this a "Controlled Substance" medicine? No  LOV: 12/11/2022  What is the name of the medication or equipment? spironolactone (ALDACTONE) 25 MG tablet nadolol (CORGARD) 20 MG tablet losartan (COZAAR) 100 MG tablet   Have you contacted your pharmacy to request a refill? No   Which pharmacy would you like this sent to?     [facility]   Address: [address] [place], [place], [place] [address] Hours:  Open ? Closes 9?PM Pharmacy: Open ? Closes 1:30?PM ? Reopens 2?PM  More hours Phone: [phone]  Patient notified that their request is being sent to the clinical staff for review and that they should receive a response within 2 business days.   Please advise at [facility] [phone]

## 2023-05-06 NOTE — Addendum Note (Signed)
Addended by: Roxanne Gates on: 05/06/2023 11:20 AM   Modules accepted: Orders

## 2023-11-19 ENCOUNTER — Other Ambulatory Visit: Payer: Self-pay | Admitting: Family Medicine

## 2023-11-19 DIAGNOSIS — I10 Essential (primary) hypertension: Secondary | ICD-10-CM

## 2023-12-07 ENCOUNTER — Other Ambulatory Visit: Payer: Self-pay | Admitting: Family Medicine

## 2023-12-07 DIAGNOSIS — I10 Essential (primary) hypertension: Secondary | ICD-10-CM

## 2024-01-04 ENCOUNTER — Other Ambulatory Visit: Payer: Self-pay | Admitting: Family Medicine

## 2024-01-04 DIAGNOSIS — I10 Essential (primary) hypertension: Secondary | ICD-10-CM

## 2024-02-01 ENCOUNTER — Telehealth: Payer: Self-pay

## 2024-02-01 ENCOUNTER — Other Ambulatory Visit: Payer: Self-pay | Admitting: Family Medicine

## 2024-02-01 DIAGNOSIS — I10 Essential (primary) hypertension: Secondary | ICD-10-CM

## 2024-02-01 MED ORDER — NADOLOL 20 MG PO TABS: 20.0000 mg | ORAL_TABLET | Freq: Every day | ORAL | 2 refills | Status: AC

## 2024-02-01 NOTE — Telephone Encounter (Signed)
 Refills sent

## 2024-02-01 NOTE — Telephone Encounter (Signed)
 Copied from CRM [id number]. Topic: Clinical - Medication Question >> Feb 01, 2024  9:49 AM Adonis Hoot wrote: Reason for CRM: Patients daughter called in stating that her mom  is staying in an assisted living in [place] right now,and they aren't allowing her to see the house doctor at the assistant living since she is a self pay resident. She has not found her a PCP there in [place] yet.She would like to know if she could have nadolol  (CORGARD ) 20 MG tablet refilled until she gets her established with a new provider where she's at?  [id number] - [place], [place] - [address]  Phone: [phone] Fax: [phone]

## 2024-02-01 NOTE — Addendum Note (Signed)
 Addended by: Ysidro Her on: 02/01/2024 01:48 PM   Modules accepted: Orders

## 2024-02-15 ENCOUNTER — Other Ambulatory Visit: Payer: Self-pay | Admitting: Family Medicine

## 2024-02-15 DIAGNOSIS — I10 Essential (primary) hypertension: Secondary | ICD-10-CM

## 2024-03-14 ENCOUNTER — Other Ambulatory Visit: Payer: Self-pay | Admitting: Family Medicine

## 2024-03-14 DIAGNOSIS — I10 Essential (primary) hypertension: Secondary | ICD-10-CM

## 2024-04-11 ENCOUNTER — Other Ambulatory Visit: Payer: Self-pay | Admitting: Family Medicine

## 2024-04-11 DIAGNOSIS — I10 Essential (primary) hypertension: Secondary | ICD-10-CM

## 2024-04-28 ENCOUNTER — Other Ambulatory Visit: Payer: Self-pay | Admitting: Family Medicine

## 2024-04-28 DIAGNOSIS — I10 Essential (primary) hypertension: Secondary | ICD-10-CM

## 2024-05-02 ENCOUNTER — Other Ambulatory Visit: Payer: Self-pay | Admitting: Family Medicine

## 2024-05-02 DIAGNOSIS — I10 Essential (primary) hypertension: Secondary | ICD-10-CM
